# Patient Record
Sex: Male | Born: 1985 | Race: White | Hispanic: No | Marital: Single | Smoking: Never smoker
Health system: Southern US, Community
[De-identification: ages and names within clinical notes are randomized; demographics above are authoritative.]

## PROBLEM LIST (undated history)

## (undated) DIAGNOSIS — E119 Type 2 diabetes mellitus without complications: Secondary | ICD-10-CM

---

## 2016-05-06 ENCOUNTER — Emergency Department (HOSPITAL_COMMUNITY)
Admission: EM | Admit: 2016-05-06 | Discharge: 2016-05-07 | Disposition: A | Discharge status: Home / Self Care | Payer: BLUE CROSS/BLUE SHIELD | Attending: Emergency Medicine | Admitting: Emergency Medicine

## 2016-05-06 ENCOUNTER — Encounter (HOSPITAL_COMMUNITY): Payer: Self-pay | Admitting: *Deleted

## 2016-05-06 ENCOUNTER — Emergency Department (HOSPITAL_COMMUNITY): Payer: BLUE CROSS/BLUE SHIELD

## 2016-05-06 DIAGNOSIS — E119 Type 2 diabetes mellitus without complications: Secondary | ICD-10-CM

## 2016-05-06 DIAGNOSIS — I10 Essential (primary) hypertension: Secondary | ICD-10-CM | POA: Diagnosis not present

## 2016-05-06 DIAGNOSIS — R509 Fever, unspecified: Secondary | ICD-10-CM | POA: Diagnosis present

## 2016-05-06 MED ORDER — SODIUM CHLORIDE 0.9 % IV BOLUS (SEPSIS)
30.0000 mL/kg | Freq: Once | INTRAVENOUS | Status: AC
  Administered 2016-05-07: 2517 mL via INTRAVENOUS

## 2016-05-06 MED ORDER — IBUPROFEN 800 MG PO TABS
800.0000 mg | ORAL_TABLET | Freq: Once | ORAL | Status: AC
  Administered 2016-05-07: 800 mg via ORAL
  Filled 2016-05-06: qty 1

## 2016-05-06 NOTE — ED Notes (Signed)
Bed: [id number] Expected date:  Expected time:  Means of arrival:  Comments: Triage 6

## 2016-05-06 NOTE — ED Notes (Addendum)
C/o fever on and off for about 3 weeks, has completed z pack. Has also been taking day quil and nyquil without relief. Has also had shortness of breath tonight, a "a little bit of coughing". Last tylenol around 2200 (1 gram).

## 2016-05-07 ENCOUNTER — Encounter (HOSPITAL_COMMUNITY): Payer: Self-pay | Admitting: Emergency Medicine

## 2016-05-07 LAB — COMPREHENSIVE METABOLIC PANEL
ALBUMIN: 4.3 g/dL (ref 3.5–5.0)
ALT: 24 U/L (ref 17–63)
ANION GAP: 8 (ref 5–15)
AST: 21 U/L (ref 15–41)
Alkaline Phosphatase: 47 U/L (ref 38–126)
BILIRUBIN TOTAL: 1.5 mg/dL — AB (ref 0.3–1.2)
BUN: 9 mg/dL (ref 6–20)
CHLORIDE: 100 mmol/L — AB (ref 101–111)
CO2: 23 mmol/L (ref 22–32)
Calcium: 8.6 mg/dL — ABNORMAL LOW (ref 8.9–10.3)
Creatinine, Ser: 0.81 mg/dL (ref 0.61–1.24)
GFR calc Af Amer: >60 mL/min (ref >60)
GFR calc non Af Amer: >60 mL/min (ref >60)
GLUCOSE: 311 mg/dL — AB (ref 65–99)
POTASSIUM: 3.7 mmol/L (ref 3.5–5.1)
SODIUM: 131 mmol/L — AB (ref 135–145)
TOTAL PROTEIN: 7.7 g/dL (ref 6.5–8.1)

## 2016-05-07 LAB — CBC WITH DIFFERENTIAL/PLATELET
BASOS PCT: 0 %
Basophils Absolute: 0 10*3/uL (ref 0.0–0.1)
Eosinophils Absolute: 0 10*3/uL (ref 0.0–0.7)
Eosinophils Relative: 0 %
HEMATOCRIT: 35.2 % — AB (ref 39.0–52.0)
HEMOGLOBIN: 12.6 g/dL — AB (ref 13.0–17.0)
LYMPHS ABS: 0.5 10*3/uL — AB (ref 0.7–4.0)
LYMPHS PCT: 8 %
MCH: 29.2 pg (ref 26.0–34.0)
MCHC: 35.8 g/dL (ref 30.0–36.0)
MCV: 81.5 fL (ref 78.0–100.0)
MONO ABS: 0.5 10*3/uL (ref 0.1–1.0)
MONOS PCT: 8 %
NEUTROS ABS: 5.3 10*3/uL (ref 1.7–7.7)
NEUTROS PCT: 84 %
Platelets: 197 10*3/uL (ref 150–400)
RBC: 4.32 MIL/uL (ref 4.22–5.81)
RDW: 12.6 % (ref 11.5–15.5)
WBC: 6.4 10*3/uL (ref 4.0–10.5)

## 2016-05-07 LAB — URINALYSIS, ROUTINE W REFLEX MICROSCOPIC
BILIRUBIN URINE: NEGATIVE
KETONES UR: NEGATIVE mg/dL
Leukocytes, UA: NEGATIVE
NITRITE: NEGATIVE
PH: 6 (ref 5.0–8.0)
Protein, ur: 30 mg/dL — AB
SPECIFIC GRAVITY, URINE: 1.016 (ref 1.005–1.030)

## 2016-05-07 LAB — URINE MICROSCOPIC-ADD ON
Bacteria, UA: NONE SEEN
Squamous Epithelial / LPF: NONE SEEN
WBC, UA: NONE SEEN WBC/hpf (ref 0–5)

## 2016-05-07 LAB — CBG MONITORING, ED: GLUCOSE-CAPILLARY: 272 mg/dL — AB (ref 65–99)

## 2016-05-07 LAB — I-STAT CG4 LACTIC ACID, ED: Lactic Acid, Venous: 1.62 mmol/L (ref 0.5–2.0)

## 2016-05-07 MED ORDER — GI COCKTAIL ~~LOC~~
30.0000 mL | Freq: Once | ORAL | Status: AC
  Administered 2016-05-07: 30 mL via ORAL
  Filled 2016-05-07: qty 30

## 2016-05-07 MED ORDER — METFORMIN HCL 500 MG PO TABS: 500.0000 mg | ORAL_TABLET | Freq: Two times a day (BID) | ORAL | Status: AC

## 2016-05-07 MED ORDER — METFORMIN HCL 500 MG PO TABS
500.0000 mg | ORAL_TABLET | Freq: Once | ORAL | Status: AC
  Administered 2016-05-07: 500 mg via ORAL
  Filled 2016-05-07: qty 1

## 2016-05-07 MED ORDER — SODIUM CHLORIDE 0.9 % IV BOLUS (SEPSIS): 500.0000 mL | Freq: Once | INTRAVENOUS | Status: DC

## 2016-05-07 NOTE — ED Notes (Signed)
Pt has a urinal at bedside and aware that a urine sample is needed. 

## 2016-05-07 NOTE — ED Provider Notes (Signed)
CSN: [id number]     Arrival date & time 05/06/16  2315 History  By signing my name below, I, Jasmyn B. Alexander, attest that this documentation has been prepared under the direction and in the presence of Sophiamarie Nease, MD.  Electronically Signed: Gillis Ends. Lyn Hollingshead, ED Scribe. 05/07/2016. 1:22 AM.      Chief Complaint  Patient presents with  . Fever   Patient is a 30 y.o. male presenting with fever. The history is provided by the patient. No language interpreter was used.  Fever Max temp prior to arrival:  103.6 Temp source:  Oral (subjective until today) Severity:  Moderate Onset quality:  Gradual Duration:  6 hours Timing:  Intermittent Progression:  Unchanged Chronicity:  Recurrent Relieved by:  Nothing Worsened by:  Nothing tried Ineffective treatments:  Acetaminophen Associated symptoms: cough   Associated symptoms: no chest pain, no confusion, no congestion, no diarrhea, no ear pain, no nausea, no rash, no somnolence, no sore throat and no vomiting   Risk factors: no hx of cancer     HPI Comments: Mike Moore is a 30 y.o. male who presents to the Emergency Department complaining of intermittent fever (TMAX 103.6) x 3 weeks. Pt reports he went to Urgent Care on 05/01/16 when he received Z pack prescription due to unknown infection, which he believes was bronchitis. Pt has completed 5 day Z pack with moderate relief. Prior to Z pack prescription, pt was taking Dayquil and Nyquil with no relief. However, he reports after consuming lunch on 05/06/16, he felts symptoms return with associated cough, shortness of breath, and itchy eyes. Symptoms have resolved at this time. Denies ear pain, throat pain, rhinorrhea,and runny eyes.  Past Medical History  Diagnosis Date  . Hypertension    No past surgical history on file. No family history on file. Social History  Substance Use Topics  . Smoking status: Never Smoker   . Smokeless tobacco: None  . Alcohol Use: No    Review of  Systems  Constitutional: Positive for fever. Negative for unexpected weight change.  HENT: Negative for congestion, ear pain, sore throat, trouble swallowing and voice change.   Eyes: Positive for itching. Negative for discharge.  Respiratory: Positive for cough. Negative for wheezing.   Cardiovascular: Negative for chest pain, palpitations and leg swelling.  Gastrointestinal: Negative for nausea, vomiting, abdominal pain and diarrhea.  Musculoskeletal: Negative for neck pain and neck stiffness.  Skin: Negative for rash.  Psychiatric/Behavioral: Negative for confusion.  All other systems reviewed and are negative.   Allergies  Review of patient's allergies indicates no known allergies.  Home Medications   Prior to Admission medications   Not on File   BP 133/92 mmHg  Pulse 101  Temp(Src) 98.9 F (37.2 C) (Oral)  Resp 16  Ht 5\' 8"  (1.727 m)  Wt 185 lb (83.915 kg)  BMI 28.14 kg/m2  SpO2 98% Physical Exam  Constitutional: He is oriented to person, place, and time. He appears well-developed and well-nourished. No distress.  HENT:  Head: Normocephalic and atraumatic.  Mouth/Throat: Oropharynx is clear and moist. No oropharyngeal exudate.  Moist mucous membranes   Eyes: Conjunctivae are normal. Pupils are equal, round, and reactive to light.  Neck: Normal range of motion. Neck supple. No JVD present.  Trachea midline No bruit  Cardiovascular: Normal rate, regular rhythm and normal heart sounds.   Pulmonary/Chest: Effort normal and breath sounds normal. No stridor. No respiratory distress. He has no wheezes. He has no rales.  Abdominal: Soft.  Bowel sounds are normal. He exhibits no distension and no mass. There is no tenderness. There is no rebound and no guarding.  Gassy  Musculoskeletal: Normal range of motion. He exhibits no edema or tenderness.  Lymphadenopathy:    He has no cervical adenopathy.  Neurological: He is alert and oriented to person, place, and time. He has  normal reflexes.  Skin: Skin is warm and dry.  Psychiatric: He has a normal mood and affect. His behavior is normal.  Nursing note and vitals reviewed.  ED Course  Procedures (including critical care time) DIAGNOSTIC STUDIES: Oxygen Saturation is 99% on RA, normal by my interpretation.    COORDINATION OF CARE: 1:13 AM-Discussed treatment plan which includes CXR, CBC panel, CMP, and UA with pt at bedside and pt agreed to plan.   Labs Review Labs Reviewed  COMPREHENSIVE METABOLIC PANEL - Abnormal; Notable for the following:    Sodium 131 (*)    Chloride 100 (*)    Glucose, Bld 311 (*)    Calcium 8.6 (*)    Total Bilirubin 1.5 (*)    All other components within normal limits  CBC WITH DIFFERENTIAL/PLATELET - Abnormal; Notable for the following:    Hemoglobin 12.6 (*)    HCT 35.2 (*)    Lymphs Abs 0.5 (*)    All other components within normal limits  URINALYSIS, ROUTINE W REFLEX MICROSCOPIC (NOT AT [facility]) - Abnormal; Notable for the following:    Glucose, UA >1000 (*)    Hgb urine dipstick MODERATE (*)    Protein, ur 30 (*)    All other components within normal limits  CULTURE, BLOOD (ROUTINE X 2)  CULTURE, BLOOD (ROUTINE X 2)  URINE CULTURE  URINE MICROSCOPIC-ADD ON  I-STAT CG4 LACTIC ACID, ED    Imaging Review Dg Chest 2 View  05/07/2016  CLINICAL DATA:  Pt c/o sob, cough, congestion, weakness, and intermittent fever x 2 weeks, pt hearing impaired EXAM: CHEST  2 VIEW COMPARISON:  None. FINDINGS: Shallow inspiration with atelectasis in the lung bases. Normal heart size and pulmonary vascularity. No focal airspace disease or consolidation in the lungs. No blunting of costophrenic angles. No pneumothorax. Mediastinal contours appear intact. Degenerative changes in the spine. IMPRESSION: Shallow inspiration with atelectasis in the lung bases. Electronically Signed   By: Burman Nieves M.D.   On: 05/07/2016 00:53   I have personally reviewed and evaluated these images and lab  results as part of my medical decision-making.  MDM   Final diagnoses:  None    Filed Vitals:   05/07/16 0008 05/07/16 0103  BP: 130/87 133/92  Pulse: 105 101  Temp: 100.6 F (38.1 C) 98.9 F (37.2 C)  Resp: 18 16    Results for orders placed or performed during the hospital encounter of 05/06/16  Comprehensive metabolic panel  Result Value Ref Range   Sodium 131 (L) 135 - 145 mmol/L   Potassium 3.7 3.5 - 5.1 mmol/L   Chloride 100 (L) 101 - 111 mmol/L   CO2 23 22 - 32 mmol/L   Glucose, Bld 311 (H) 65 - 99 mg/dL   BUN 9 6 - 20 mg/dL   Creatinine, Ser 9.60 0.61 - 1.24 mg/dL   Calcium 8.6 (L) 8.9 - 10.3 mg/dL   Total Protein 7.7 6.5 - 8.1 g/dL   Albumin 4.3 3.5 - 5.0 g/dL   AST 21 15 - 41 U/L   ALT 24 17 - 63 U/L   Alkaline Phosphatase 47 38 - 126  U/L   Total Bilirubin 1.5 (H) 0.3 - 1.2 mg/dL   GFR calc non Af Amer >60 >60 mL/min   GFR calc Af Amer >60 >60 mL/min   Anion gap 8 5 - 15  CBC with Differential  Result Value Ref Range   WBC 6.4 4.0 - 10.5 K/uL   RBC 4.32 4.22 - 5.81 MIL/uL   Hemoglobin 12.6 (L) 13.0 - 17.0 g/dL   HCT 40.9 (L) 81.1 - 91.4 %   MCV 81.5 78.0 - 100.0 fL   MCH 29.2 26.0 - 34.0 pg   MCHC 35.8 30.0 - 36.0 g/dL   RDW 78.2 95.6 - 21.3 %   Platelets 197 150 - 400 K/uL   Neutrophils Relative % 84 %   Neutro Abs 5.3 1.7 - 7.7 K/uL   Lymphocytes Relative 8 %   Lymphs Abs 0.5 (L) 0.7 - 4.0 K/uL   Monocytes Relative 8 %   Monocytes Absolute 0.5 0.1 - 1.0 K/uL   Eosinophils Relative 0 %   Eosinophils Absolute 0.0 0.0 - 0.7 K/uL   Basophils Relative 0 %   Basophils Absolute 0.0 0.0 - 0.1 K/uL  Urinalysis, Routine w reflex microscopic  Result Value Ref Range   Color, Urine YELLOW YELLOW   APPearance CLEAR CLEAR   Specific Gravity, Urine 1.016 1.005 - 1.030   pH 6.0 5.0 - 8.0   Glucose, UA >1000 (A) NEGATIVE mg/dL   Hgb urine dipstick MODERATE (A) NEGATIVE   Bilirubin Urine NEGATIVE NEGATIVE   Ketones, ur NEGATIVE NEGATIVE mg/dL   Protein,  ur 30 (A) NEGATIVE mg/dL   Nitrite NEGATIVE NEGATIVE   Leukocytes, UA NEGATIVE NEGATIVE  Urine microscopic-add on  Result Value Ref Range   Squamous Epithelial / LPF NONE SEEN NONE SEEN   WBC, UA NONE SEEN 0 - 5 WBC/hpf   RBC / HPF 0-5 0 - 5 RBC/hpf   Bacteria, UA NONE SEEN NONE SEEN  I-Stat CG4 Lactic Acid, ED  Result Value Ref Range   Lactic Acid, Venous 1.62 0.5 - 2.0 mmol/L  POC CBG, ED  Result Value Ref Range   Glucose-Capillary 272 (H) 65 - 99 mg/dL   Dg Chest 2 View  0/86/5784  CLINICAL DATA:  Pt c/o sob, cough, congestion, weakness, and intermittent fever x 2 weeks, pt hearing impaired EXAM: CHEST  2 VIEW COMPARISON:  None. FINDINGS: Shallow inspiration with atelectasis in the lung bases. Normal heart size and pulmonary vascularity. No focal airspace disease or consolidation in the lungs. No blunting of costophrenic angles. No pneumothorax. Mediastinal contours appear intact. Degenerative changes in the spine. IMPRESSION: Shallow inspiration with atelectasis in the lung bases. Electronically Signed   By: Burman Nieves M.D.   On: 05/07/2016 00:53    Medications  sodium chloride 0.9 % bolus 500 mL (not administered)  sodium chloride 0.9 % bolus 2,517 mL (0 mL/kg  83.9 kg Intravenous Stopped 05/07/16 0256)  ibuprofen (ADVIL,MOTRIN) tablet 800 mg (800 mg Oral Given 05/07/16 0015)  metFORMIN (GLUCOPHAGE) tablet 500 mg (500 mg Oral Given 05/07/16 0210)  gi cocktail (Maalox,Lidocaine,Donnatal) (30 mLs Oral Given 05/07/16 0211)    laughing in the room smiling well appearing. Exam is benign and reassuring.  New onset DM in patient.  Will start metformin follow up with your PMD in 2 days.  Carb modified diet.  Symptoms consistent with viral illness.  Zpak is still active in patients blood stream.  Strict return precautions given  I personally performed the services described in this  documentation, which was scribed in my presence. The recorded information has been reviewed and is  accurate.      Cy BlamerApril Brodrick Curran, MD 05/07/16 [id number]

## 2016-05-07 NOTE — Discharge Instructions (Signed)
Diabetes and Exercise Exercising regularly is important. It is not just about losing weight. It has many health benefits, such as:  Improving your overall fitness, flexibility, and endurance.  Increasing your bone density.  Helping with weight control.  Decreasing your body fat.  Increasing your muscle strength.  Reducing stress and tension.  Improving your overall health. People with diabetes who exercise gain additional benefits because exercise:  Reduces appetite.  Improves the body's use of blood sugar (glucose).  Helps lower or control blood glucose.  Decreases blood pressure.  Helps control blood lipids (such as cholesterol and triglycerides).  Improves the body's use of the hormone insulin by:  Increasing the body's insulin sensitivity.  Reducing the body's insulin needs.  Decreases the risk for heart disease because exercising:  Lowers cholesterol and triglycerides levels.  Increases the levels of good cholesterol (such as high-density lipoproteins [HDL]) in the body.  Lowers blood glucose levels. YOUR ACTIVITY PLAN  Choose an activity that you enjoy, and set realistic goals. To exercise safely, you should begin practicing any new physical activity slowly, and gradually increase the intensity of the exercise over time. Your health care provider or diabetes educator can help create an activity plan that works for you. General recommendations include:  Encouraging children to engage in at least 60 minutes of physical activity each day.  Stretching and performing strength training exercises, such as yoga or weight lifting, at least 2 times per week.  Performing a total of at least 150 minutes of moderate-intensity exercise each week, such as brisk walking or water aerobics.  Exercising at least 3 days per week, making sure you allow no more than 2 consecutive days to pass without exercising.  Avoiding long periods of inactivity (90 minutes or more). When you  have to spend an extended period of time sitting down, take frequent breaks to walk or stretch. RECOMMENDATIONS FOR EXERCISING WITH TYPE 1 OR TYPE 2 DIABETES   Check your blood glucose before exercising. If blood glucose levels are greater than 240 mg/dL, check for urine ketones. Do not exercise if ketones are present.  Avoid injecting insulin into areas of the body that are going to be exercised. For example, avoid injecting insulin into:  The arms when playing tennis.  The legs when jogging.  Keep a record of:  Food intake before and after you exercise.  Expected peak times of insulin action.  Blood glucose levels before and after you exercise.  The type and amount of exercise you have done.  Review your records with your health care provider. Your health care provider will help you to develop guidelines for adjusting food intake and insulin amounts before and after exercising.  If you take insulin or oral hypoglycemic agents, watch for signs and symptoms of hypoglycemia. They include:  Dizziness.  Shaking.  Sweating.  Chills.  Confusion.  Drink plenty of water while you exercise to prevent dehydration or heat stroke. Body water is lost during exercise and must be replaced.  Talk to your health care provider before starting an exercise program to make sure it is safe for you. Remember, almost any type of activity is better than none.   This information is not intended to replace advice given to you by your health care provider. Make sure you discuss any questions you have with your health care provider.   Document Released: 02/01/2004 Document Revised: 03/28/2015 Document Reviewed: 04/20/2013 Elsevier Interactive Patient Education 2016 [employer]  

## 2016-05-08 LAB — URINE CULTURE: Culture: <10000 — AB

## 2016-05-12 LAB — CULTURE, BLOOD (ROUTINE X 2)
CULTURE: NO GROWTH
Culture: NO GROWTH

## 2016-05-23 ENCOUNTER — Encounter (HOSPITAL_COMMUNITY): Payer: Self-pay | Admitting: Emergency Medicine

## 2016-05-23 ENCOUNTER — Inpatient Hospital Stay (HOSPITAL_COMMUNITY)
Admission: EM | Admit: 2016-05-23 | Discharge: 2016-05-25 | DRG: 690 | Disposition: A | Discharge status: Home / Self Care | Payer: BLUE CROSS/BLUE SHIELD | Attending: Internal Medicine | Admitting: Internal Medicine

## 2016-05-23 ENCOUNTER — Other Ambulatory Visit: Payer: Self-pay

## 2016-05-23 DIAGNOSIS — N151 Renal and perinephric abscess: Secondary | ICD-10-CM | POA: Diagnosis not present

## 2016-05-23 DIAGNOSIS — D649 Anemia, unspecified: Secondary | ICD-10-CM | POA: Diagnosis present

## 2016-05-23 DIAGNOSIS — E118 Type 2 diabetes mellitus with unspecified complications: Secondary | ICD-10-CM

## 2016-05-23 DIAGNOSIS — R161 Splenomegaly, not elsewhere classified: Secondary | ICD-10-CM | POA: Diagnosis present

## 2016-05-23 DIAGNOSIS — R1031 Right lower quadrant pain: Secondary | ICD-10-CM

## 2016-05-23 DIAGNOSIS — Z7984 Long term (current) use of oral hypoglycemic drugs: Secondary | ICD-10-CM | POA: Diagnosis not present

## 2016-05-23 DIAGNOSIS — Z79899 Other long term (current) drug therapy: Secondary | ICD-10-CM | POA: Diagnosis not present

## 2016-05-23 DIAGNOSIS — K8020 Calculus of gallbladder without cholecystitis without obstruction: Secondary | ICD-10-CM

## 2016-05-23 DIAGNOSIS — N12 Tubulo-interstitial nephritis, not specified as acute or chronic: Secondary | ICD-10-CM | POA: Diagnosis present

## 2016-05-23 DIAGNOSIS — R319 Hematuria, unspecified: Secondary | ICD-10-CM

## 2016-05-23 DIAGNOSIS — R52 Pain, unspecified: Secondary | ICD-10-CM

## 2016-05-23 DIAGNOSIS — E1165 Type 2 diabetes mellitus with hyperglycemia: Secondary | ICD-10-CM | POA: Diagnosis present

## 2016-05-23 HISTORY — DX: Type 2 diabetes mellitus without complications: E11.9

## 2016-05-23 LAB — URINALYSIS, ROUTINE W REFLEX MICROSCOPIC
BILIRUBIN URINE: NEGATIVE
Glucose, UA: NEGATIVE mg/dL
KETONES UR: NEGATIVE mg/dL
Leukocytes, UA: NEGATIVE
Nitrite: NEGATIVE
PH: 6 (ref 5.0–8.0)
Protein, ur: NEGATIVE mg/dL

## 2016-05-23 LAB — COMPREHENSIVE METABOLIC PANEL
ALBUMIN: 3.8 g/dL (ref 3.5–5.0)
ALK PHOS: 51 U/L (ref 38–126)
ALT: 16 U/L — ABNORMAL LOW (ref 17–63)
ANION GAP: 11 (ref 5–15)
AST: 16 U/L (ref 15–41)
BILIRUBIN TOTAL: 0.8 mg/dL (ref 0.3–1.2)
BUN: 9 mg/dL (ref 6–20)
CALCIUM: 8.3 mg/dL — AB (ref 8.9–10.3)
CO2: 23 mmol/L (ref 22–32)
Chloride: 99 mmol/L — ABNORMAL LOW (ref 101–111)
Creatinine, Ser: 0.76 mg/dL (ref 0.61–1.24)
GFR calc Af Amer: >60 mL/min (ref >60)
GFR calc non Af Amer: >60 mL/min (ref >60)
GLUCOSE: 187 mg/dL — AB (ref 65–99)
Potassium: 3.8 mmol/L (ref 3.5–5.1)
Sodium: 133 mmol/L — ABNORMAL LOW (ref 135–145)
TOTAL PROTEIN: 7.9 g/dL (ref 6.5–8.1)

## 2016-05-23 LAB — DIFFERENTIAL
BASOS ABS: 0 10*3/uL (ref 0.0–0.1)
BASOS PCT: 1 %
EOS ABS: 0.1 10*3/uL (ref 0.0–0.7)
Eosinophils Relative: 2 %
LYMPHS ABS: 0.9 10*3/uL (ref 0.7–4.0)
Lymphocytes Relative: 21 %
Monocytes Absolute: 0.3 10*3/uL (ref 0.1–1.0)
Monocytes Relative: 8 %
NEUTROS ABS: 2.9 10*3/uL (ref 1.7–7.7)
NEUTROS PCT: 68 %

## 2016-05-23 LAB — URINE MICROSCOPIC-ADD ON
Bacteria, UA: NONE SEEN
Squamous Epithelial / LPF: NONE SEEN

## 2016-05-23 LAB — CBC
HCT: 29.1 % — ABNORMAL LOW (ref 39.0–52.0)
HEMOGLOBIN: 10.1 g/dL — AB (ref 13.0–17.0)
MCH: 27.4 pg (ref 26.0–34.0)
MCHC: 34.7 g/dL (ref 30.0–36.0)
MCV: 79.1 fL (ref 78.0–100.0)
Platelets: 314 10*3/uL (ref 150–400)
RBC: 3.68 MIL/uL — ABNORMAL LOW (ref 4.22–5.81)
RDW: 12.5 % (ref 11.5–15.5)
WBC: 4.1 10*3/uL (ref 4.0–10.5)

## 2016-05-23 LAB — MONONUCLEOSIS SCREEN: Mono Screen: NEGATIVE

## 2016-05-23 LAB — LIPASE, BLOOD: Lipase: 37 U/L (ref 11–51)

## 2016-05-23 LAB — GLUCOSE, CAPILLARY: GLUCOSE-CAPILLARY: 122 mg/dL — AB (ref 65–99)

## 2016-05-23 MED ORDER — INSULIN ASPART 100 UNIT/ML ~~LOC~~ SOLN: 0.0000 [IU] | Freq: Every day | SUBCUTANEOUS | Status: DC

## 2016-05-23 MED ORDER — PNEUMOCOCCAL VAC POLYVALENT 25 MCG/0.5ML IJ INJ
0.5000 mL | INJECTION | INTRAMUSCULAR | Status: AC
Start: 2016-05-24 — End: 2016-05-24
  Administered 2016-05-24: 0.5 mL via INTRAMUSCULAR
  Filled 2016-05-23 (×2): qty 0.5

## 2016-05-23 MED ORDER — ONDANSETRON HCL 4 MG PO TABS
4.0000 mg | ORAL_TABLET | Freq: Four times a day (QID) | ORAL | Status: DC | PRN
Start: 2016-05-23 — End: 2016-05-25

## 2016-05-23 MED ORDER — SODIUM CHLORIDE 0.9 % IV BOLUS (SEPSIS)
1000.0000 mL | Freq: Once | INTRAVENOUS | Status: AC
  Administered 2016-05-23: 1000 mL via INTRAVENOUS

## 2016-05-23 MED ORDER — ACETAMINOPHEN 325 MG PO TABS: 650.0000 mg | ORAL_TABLET | Freq: Four times a day (QID) | ORAL | Status: DC | PRN

## 2016-05-23 MED ORDER — SODIUM CHLORIDE 0.9 % IV SOLN
INTRAVENOUS | Status: AC
  Administered 2016-05-23: 23:00:00 via INTRAVENOUS

## 2016-05-23 MED ORDER — ACETAMINOPHEN 650 MG RE SUPP: 650.0000 mg | Freq: Four times a day (QID) | RECTAL | Status: DC | PRN

## 2016-05-23 MED ORDER — INSULIN ASPART 100 UNIT/ML ~~LOC~~ SOLN
0.0000 [IU] | Freq: Three times a day (TID) | SUBCUTANEOUS | Status: DC
  Administered 2016-05-24 (×2): 3 [IU] via SUBCUTANEOUS
  Administered 2016-05-25: 2 [IU] via SUBCUTANEOUS
  Administered 2016-05-25: 3 [IU] via SUBCUTANEOUS
  Administered 2016-05-25: 2 [IU] via SUBCUTANEOUS

## 2016-05-23 MED ORDER — ONDANSETRON HCL 4 MG/2ML IJ SOLN: 4.0000 mg | Freq: Four times a day (QID) | INTRAMUSCULAR | Status: DC | PRN

## 2016-05-23 MED ORDER — DEXTROSE 5 % IV SOLN
1.0000 g | Freq: Every day | INTRAVENOUS | Status: DC
  Administered 2016-05-23 – 2016-05-24 (×2): 1 g via INTRAVENOUS
  Filled 2016-05-23 (×4): qty 10

## 2016-05-23 MED ORDER — HYDROCODONE-ACETAMINOPHEN 5-325 MG PO TABS: 1.0000 | ORAL_TABLET | ORAL | Status: DC | PRN

## 2016-05-23 MED ORDER — CEFTRIAXONE SODIUM 1 G IJ SOLR: 1.0000 g | Freq: Once | INTRAMUSCULAR | Status: DC

## 2016-05-23 MED ORDER — ENOXAPARIN SODIUM 40 MG/0.4ML ~~LOC~~ SOLN
40.0000 mg | Freq: Every day | SUBCUTANEOUS | Status: DC
  Administered 2016-05-24: 40 mg via SUBCUTANEOUS
  Filled 2016-05-23: qty 0.4

## 2016-05-23 NOTE — ED Notes (Signed)
Per PCP-patient with right lower abdominal pain, fevers-states CT positive for pyelo, enlarged spleen, HgB 10-to ED for admission, IV antibiotics

## 2016-05-23 NOTE — ED Notes (Signed)
Pt sent by PCP for abnormal CT results of pyelonephritis and splenomegaly. Has had dysuria for the past week. Has not been on abx yet.

## 2016-05-23 NOTE — H&P (Addendum)
Mike BeathRobert Moore [id number] DOB: July 16, 1986 DOA: 05/23/2016     PCP: No PCP Per Patient   Outpatient Specialists: none Patient coming from:    home Lives alone,        Chief Complaint: Intermittent fevers  HPI: Mike Moore is a 30 y.o. male with medical history significant of recently diagnosed diabetes     Presented with about 1 month of intermittent fevers on and off he was treated for presumed bronchitis with a Z-Pak his fever would go away but then would return. He was seen in the emergency department and diagnosed with viral illness on 13 of June at that time his hemoglobin was 12.6 His urine and blood cultures at that time did not grow anything.  About 2 weeks ago he developed right flank Lower quadrant pain, also he continued to have recurrent fevers and presented to [facility]. Given right lower quadrant pain they will concern for appendicitis and  obtained CT of the abdomen and pelvis which was read today showing perinephric stranding with microabscess and splenomegaly he was sent  to emergency department. His fevers is currently resolved he have not had been having nausea vomiting or diarrhea no abdominal discomfort or flank pain. He had had persistent blood in urine Patient was recently diagnosed with diabetes and started on metformin Labs were obtained at his PCP showed WBC 6.1 1.5 absolute lymphocytic count in 4.5 absolute neutrophil count hemoglobin 10.7 which is new platelets 359  Hemoglobin A1c is 9.1 LFTs are normal   IN ER: Afebrile heart rate 85 respirations 15 blood pressure 142/90 CBC 4.1 hemoglobin 10.1 sodium 133 calcium 8.3 glucose 187 Outside CT scan was read by radiologist confirms the findings    Hospitalist was called for admission for perinephric abscess and pyelonephritis with splenomegaly  Review of Systems:    Pertinent positives include: Fevers, chills, fatigue, right flank pain right abdominal pain  Constitutional:  No weight loss, night  sweats,  weight loss  HEENT:  No headaches, Difficulty swallowing,Tooth/dental problems,Sore throat,  No sneezing, itching, ear ache, nasal congestion, post nasal drip,  Cardio-vascular:  No chest pain, Orthopnea, PND, anasarca, dizziness, palpitations.no Bilateral lower extremity swelling  GI:  No heartburn, indigestion,   nausea, vomiting, diarrhea, change in bowel habits, loss of appetite, melena, blood in stool, hematemesis Resp:  no shortness of breath at rest. No dyspnea on exertion, No excess mucus, no productive cough, No non-productive cough, No coughing up of blood.No change in color of mucus.No wheezing. Skin:  no rash or lesions. No jaundice GU:  no dysuria, change in color of urine, no urgency or frequency. No straining to urinate.  No flank pain.  Musculoskeletal:  No joint pain or no joint swelling. No decreased range of motion. No back pain.  Psych:  No change in mood or affect. No depression or anxiety. No memory loss.  Neuro: no localizing neurological complaints, no tingling, no weakness, no double vision, no gait abnormality, no slurred speech, no confusion  As per HPI otherwise 10 point review of systems negative.   Past Medical History: Past Medical History  Diagnosis Date  . Diabetes mellitus without complication (HCC)    History reviewed. No pertinent past surgical history.   Social History:  Ambulatory   Independently    reports that he has never smoked. He does not have any smokeless tobacco history on file. He reports that he does not drink alcohol or use illicit drugs.  Allergies:  No Known Allergies  Family History:  Family History  Problem Relation Age of Onset  . Breast cancer Mother   . Liver disease Father   . Alcohol abuse Father   . Diabetes Other   . Lung cancer Other   . Liver cancer Other   . Kidney disease Neg Hx     Medications: Prior to Admission medications   Medication Sig Start Date End Date Taking? Authorizing  Provider  ibuprofen (ADVIL,MOTRIN) 200 MG tablet Take 400 mg by mouth every 6 (six) hours as needed for fever.    Yes Historical Provider, MD  metFORMIN (GLUCOPHAGE) 500 MG tablet Take 1 tablet (500 mg total) by mouth 2 (two) times daily with a meal. Patient taking differently: Take 500 mg by mouth 2 (two) times daily with a meal. Takes with lunch and dinner 05/07/16  Yes April Palumbo, MD    Physical Exam: Patient Vitals for the past 24 hrs:  BP Temp Temp src Pulse Resp SpO2 Height Weight  05/23/16 1901 - - - - - - 5\' 8"  (1.727 m) 81.647 kg (180 lb)  05/23/16 1848 142/90 mmHg 97.9 F (36.6 C) Oral 85 15 100 % - -  05/23/16 1509 115/80 mmHg 98.3 F (36.8 C) Oral 92 16 100 % - -    1. General:  in No Acute distress, hearing impaired 2. Psychological: Alert and   Oriented 3. Head/ENT:   Moist  Mucous Membranes                          Head Non traumatic, neck supple                          Normal  Dentition 4. SKIN:  decreased Skin turgor,  Skin clean Dry and intact no rash 5. Heart: Regular rate and rhythm no Murmur, Rub or gallop 6. Lungs:  Clear to auscultation bilaterally, no wheezes or crackles   7. Abdomen: Soft, non-tender, Non distended 8. Lower extremities: no clubbing, cyanosis, or edema 9. Neurologically Grossly intact, moving all 4 extremities equally 10. MSK: Normal range of motion   body mass index is 27.38 kg/(m^2).  Labs on Admission:   Labs on Admission: I have personally reviewed following labs and imaging studies  CBC:  Recent Labs Lab 05/23/16 1526  WBC 4.1  HGB 10.1*  HCT 29.1*  MCV 79.1  PLT 314   Basic Metabolic Panel:  Recent Labs Lab 05/23/16 1526  NA 133*  K 3.8  CL 99*  CO2 23  GLUCOSE 187*  BUN 9  CREATININE 0.76  CALCIUM 8.3*   GFR: Estimated Creatinine Clearance: 129.4 mL/min (by C-G formula based on Cr of 0.76). Liver Function Tests:  Recent Labs Lab 05/23/16 1526  AST 16  ALT 16*  ALKPHOS 51  BILITOT 0.8  PROT 7.9    ALBUMIN 3.8    Recent Labs Lab 05/23/16 1526  LIPASE 37   No results for input(s): AMMONIA in the last 168 hours. Coagulation Profile: No results for input(s): INR, PROTIME in the last 168 hours. Cardiac Enzymes: No results for input(s): CKTOTAL, CKMB, CKMBINDEX, TROPONINI in the last 168 hours. BNP (last 3 results) No results for input(s): PROBNP in the last 8760 hours. HbA1C: No results for input(s): HGBA1C in the last 72 hours. CBG: No results for input(s): GLUCAP in the last 168 hours. Lipid Profile: No results for input(s): CHOL, HDL, LDLCALC, TRIG, CHOLHDL, LDLDIRECT in the last  72 hours. Thyroid Function Tests: No results for input(s): TSH, T4TOTAL, FREET4, T3FREE, THYROIDAB in the last 72 hours. Anemia Panel: No results for input(s): VITAMINB12, FOLATE, FERRITIN, TIBC, IRON, RETICCTPCT in the last 72 hours. Urine analysis:    Component Value Date/Time   COLORURINE YELLOW 05/23/2016 1836   APPEARANCEUR CLEAR 05/23/2016 1836   LABSPEC >1.046* 05/23/2016 1836   PHURINE 6.0 05/23/2016 1836   GLUCOSEU NEGATIVE 05/23/2016 1836   HGBUR MODERATE* 05/23/2016 1836   BILIRUBINUR NEGATIVE 05/23/2016 1836   KETONESUR NEGATIVE 05/23/2016 1836   PROTEINUR NEGATIVE 05/23/2016 1836   NITRITE NEGATIVE 05/23/2016 1836   LEUKOCYTESUR NEGATIVE 05/23/2016 1836   Sepsis Labs: (procalcitonin:4,lacticidven:4) )No results found for this or any previous visit (from the past 240 hour(s)).     UA   no evidence of UTI But hematuria  No results found for: HGBA1C  Estimated Creatinine Clearance: 129.4 mL/min (by C-G formula based on Cr of 0.76).  BNP (last 3 results) No results for input(s): PROBNP in the last 8760 hours.   ECG REPORT Not obtained  [facility] Weights   05/23/16 1901  Weight: 81.647 kg (180 lb)     Cultures:    Component Value Date/Time   SDES URINE, CLEAN CATCH 05/07/2016 0051   SPECREQUEST NONE 05/07/2016 0051   CULT * 05/07/2016 0051    <10,000  COLONIES/mL INSIGNIFICANT GROWTH Performed at [facility]    REPTSTATUS 05/08/2016 FINAL 05/07/2016 0051     Radiological Exams on Admission: Ct Outside Films Body  05/23/2016  CLINICAL DATA:  This exam is stored here for comparison purposes only and was performed at an outside facility.   Please contact the originating institution for any associated interpretation or report.    Chart has been reviewed    Assessment/Plan   30 y.o. male with medical history significant of recently diagnosed diabetes found to have evidence of pyelonephritis on CT scan with associated micro-perinephric abscess and splenomegaly  Present on Admission:  . DM type 2 causing complication (HCC) - will order sliding scale hold metformin  . Pyelonephritis - treat with Rocephin etiology unclear urine culture so far has been negative  . Perinephric abscess -minimal will not require an dementia radiology draining. If patient does worse medically may require reimaging  . Splenomegaly -etiology unclear Will test for viral etiology. Check HIV status. Told patient about hyper splenic precautions. He may need to have follow-up with hematology oncology for is no resolution  . Anemia - obtain anemia panel Hemoccult stool continue to monitor  Other plan as per orders.  DVT prophylaxis:     Lovenox     Code Status:  FULL CODE  as per patient    Family Communication:   Family not at  Bedside    Disposition Plan:     To home once workup is complete and patient is stable   Consults called: none    Admission status:    inpatient      Level of care     medical floor     Alexie Samson 05/23/2016, 9:55 PM    [facility]  Pager [phone]   after 2 AM please page floor coverage PA If 7AM-7PM, please contact the day team taking care of the patient  [email or web address]  Password [email or web address]

## 2016-05-23 NOTE — ED Provider Notes (Signed)
CSN: [id number]     Arrival date & time 05/23/16  1355 History   First MD Initiated Contact with Patient 05/23/16 1759     Chief Complaint  Patient presents with  . Pyelonephritis     (Consider location/radiation/quality/duration/timing/severity/associated sxs/prior Treatment) HPI Comments: Mike Moore is a 30 y.o. male with a PMHx of ?HTN, hearing impairment, and DM2, who presents to the ED sent in by his regular doctor with abnormal CT results. He is a very strange convoluted story that began approximately 1 month ago where he states he had fevers and thought he had a virus, recovered from this, then 2 weeks ago he came into the ED for fevers and URI symptoms again, was diagnosed with diabetes and started on metformin, and states that this illness also resolved, but he began having mild abdominal pain after that, which worsened approximately 1 week ago. On Friday and Saturday 5 days ago he developed fevers again with Tmax 101.6 but they've resolved since then and have not returned. Due to the ongoing abdominal pain he went to see his primary care doctor Manon Hilding PA-C at [facility]) yesterday who sent him for a CT scan today that he had done at [facility] on [facility], and was called and told that it showed that he had "a kidney infection, kidney abscess, and enlarged spleen". He brought the CD with him of the images from the CT scan, but left it in his car. He is not sure if he has the CT reading as well. He states he had a rectal exam done yesterday at the PCP's office and was told that he did not have any blood in his stool.  He states that his abdominal pain was originally 7/10 on Friday, but has since resolved and is currently 0/10, describing it as intermittent cramping in the suprapubic and right lower quadrant region, nonradiating, worse with palpation, and with no treatments tried for his pain. He denies any ongoing fevers, chills, chest pain, shortness of breath, cough, nausea,  vomiting, diarrhea, constipation, obstipation, melena, hematochezia, rectal pain, dysuria, hematuria, malodorous urine, increased urine frequency, testicular pain or swelling, penile discharge, flank pain, numbness, tingling, or focal weakness.  He denies any recent travel, sick contacts, suspicious food intake, alcohol use, or chronic NSAID use.  Patient is a 30 y.o. male presenting with abdominal pain. The history is provided by the patient and medical records. No language interpreter was used.  Abdominal Pain Pain location:  Suprapubic and RLQ Pain quality: cramping   Pain radiates to:  Does not radiate Pain severity:  Moderate Onset quality:  Gradual Duration:  1 week Timing:  Intermittent Progression:  Unchanged Chronicity:  New Context: recent illness   Context: not recent travel, not sick contacts and not suspicious food intake   Relieved by:  None tried Worsened by:  Palpation Ineffective treatments:  None tried Associated symptoms: fever (last week Fri/Sat with TMax 101.6 but then resolved since then)   Associated symptoms: no chest pain, no chills, no constipation, no cough, no diarrhea, no dysuria, no flatus, no hematemesis, no hematochezia, no hematuria, no melena, no nausea, no shortness of breath and no vomiting   Risk factors: no alcohol abuse and no NSAID use     Past Medical History  Diagnosis Date  . Diabetes mellitus without complication (HCC)    History reviewed. No pertinent past surgical history. History reviewed. No pertinent family history. Social History  Substance Use Topics  . Smoking status: Never Smoker   .  Smokeless tobacco: None  . Alcohol Use: No    Review of Systems  Constitutional: Positive for fever (last week Fri/Sat with TMax 101.6 but then resolved since then). Negative for chills.  Respiratory: Negative for cough and shortness of breath.   Cardiovascular: Negative for chest pain.  Gastrointestinal: Positive for abdominal pain  (RLQ/suprapubic). Negative for nausea, vomiting, diarrhea, constipation, blood in stool, melena, hematochezia, anal bleeding, rectal pain, flatus and hematemesis.  Genitourinary: Negative for dysuria, frequency, hematuria, flank pain, discharge, penile swelling, scrotal swelling, penile pain and testicular pain.  Musculoskeletal: Negative for myalgias and arthralgias.  Skin: Negative for color change.  Allergic/Immunologic: Positive for immunocompromised state (diabetic).  Neurological: Negative for weakness and numbness.  Psychiatric/Behavioral: Negative for confusion.   10 Systems reviewed and are negative for acute change except as noted in the HPI.    Allergies  Review of patient's allergies indicates no known allergies.  Home Medications   Prior to Admission medications   Medication Sig Start Date End Date Taking? Authorizing Provider  acetaminophen (TYLENOL) 500 MG tablet Take 1,000 mg by mouth every 6 (six) hours as needed for mild pain.    Historical Provider, MD  ibuprofen (ADVIL,MOTRIN) 200 MG tablet Take 200 mg by mouth every 6 (six) hours as needed for fever.    Historical Provider, MD  metFORMIN (GLUCOPHAGE) 500 MG tablet Take 1 tablet (500 mg total) by mouth 2 (two) times daily with a meal. 05/07/16   April Palumbo, MD   BP 115/80 mmHg  Pulse 92  Temp(Src) 98.3 F (36.8 C) (Oral)  Resp 16  SpO2 100% Physical Exam  Constitutional: He is oriented to person, place, and time. Vital signs are normal. He appears well-developed and well-nourished.  Non-toxic appearance. No distress.  Afebrile, nontoxic, NAD  HENT:  Head: Normocephalic and atraumatic.  Mouth/Throat: Oropharynx is clear and moist and mucous membranes are normal.  Eyes: Conjunctivae and EOM are normal. Right eye exhibits no discharge. Left eye exhibits no discharge.  Neck: Normal range of motion. Neck supple.  Cardiovascular: Normal rate, regular rhythm, normal heart sounds and intact distal pulses.  Exam  reveals no gallop and no friction rub.   No murmur heard. Pulmonary/Chest: Effort normal and breath sounds normal. No respiratory distress. He has no decreased breath sounds. He has no wheezes. He has no rhonchi. He has no rales.  Abdominal: Soft. Normal appearance and bowel sounds are normal. He exhibits no distension. There is no tenderness. There is no rigidity, no rebound, no guarding, no CVA tenderness, no tenderness at McBurney's point and negative Murphy's sign.  Soft, NTND, +BS throughout, no r/g/r, neg murphy's, neg mcburney's, no CVA TTP   Musculoskeletal: Normal range of motion.  Neurological: He is alert and oriented to person, place, and time. He has normal strength. No sensory deficit.  Skin: Skin is warm, dry and intact. No rash noted.  Psychiatric: He has a normal mood and affect.  Nursing note and vitals reviewed.   ED Course  Procedures (including critical care time) Labs Review Labs Reviewed  COMPREHENSIVE METABOLIC PANEL - Abnormal; Notable for the following:    Sodium 133 (*)    Chloride 99 (*)    Glucose, Bld 187 (*)    Calcium 8.3 (*)    ALT 16 (*)    All other components within normal limits  CBC - Abnormal; Notable for the following:    RBC 3.68 (*)    Hemoglobin 10.1 (*)    HCT 29.1 (*)  All other components within normal limits  URINALYSIS, ROUTINE W REFLEX MICROSCOPIC (NOT AT [facility]) - Abnormal; Notable for the following:    Specific Gravity, Urine >1.046 (*)    Hgb urine dipstick MODERATE (*)    All other components within normal limits  URINE CULTURE  LIPASE, BLOOD  MONONUCLEOSIS SCREEN  URINE MICROSCOPIC-ADD ON  DIFFERENTIAL    Imaging Review Ct Outside Films Body  05/23/2016  CLINICAL DATA:  This exam is stored here for comparison purposes only and was performed at an outside facility.   Please contact the originating institution for any associated interpretation or report.   I have personally reviewed and evaluated these images and lab  results as part of my medical decision-making.   EKG Interpretation None      MDM   Final diagnoses:  RLQ abdominal pain  Pyelonephritis  Gallstones  Splenomegaly  Anemia, unspecified anemia type  Type 2 diabetes mellitus with hyperglycemia, without long-term current use of insulin (HCC)  Hematuria    30 y.o. male here with a very strange and convoluted story which begins 1 month ago when he had fevers and had a virus, recovered, then 2 weeks ago had fevers return, and was told that he was diabetic and was started on metformin, that episode also resolved, but then approximately 1 week ago he developed right lower quadrant abdominal pain and fevers again. His abd pain persisted but the fevers resolved. He finally went to his regular doctor yesterday who obtained a CT scan today and it showed "kidney infection and abscess, and an enlarged spleen" per his report. He brought the CD with him of the CT images, but [facility] so there is no record of these results anywhere. There's also no notes from the [facility] physicians visit on care everywhere. Strangely, his exam is completely benign, no abdominal tenderness, no flank tenderness. He has no urinary complaints either which makes pyelonephritis even stranger of a diagnosis. His labs here show: lipase WNL, CBC with mildly low H/H 10.1/29.1 but no leukocytosis. (Of note, his FOBT was done yesterday and was neg; states the rectal exam was not painful, so prostatitis less likely as well). CMP with gluc 187 without anion gap, otherwise unremarkable. U/A not yet given. Will get monoscreen and UCx, and review the images from the CT scan, then proceed with further work up/management based on these images. Denies wanting anything for pain at this time since his symptoms are resolved. Discussed case with my attending Dr. Fayrene FearingJames who agrees with plan. Will reassess shortly  7:25 PM He brought the CT images in from his car, reviewed them with Dr. Fayrene FearingJames  and we can see where he has splenomegaly as well as R perinephric stranding but the kidneys themselves BOTH have what appear to be cysts: 2 on the R kidney and 1 on the L kidney; I suppose these could be abscesses and not cysts, but both Dr. Fayrene FearingJames and I couldn't exactly define it. I called radiologist who stated that he couldn't read imaging from [facility] unless we loaded the images into the system and then they could read it; will have CT tech load images and then call back to get a stat read. Of note, U/A with moderate hgb, 6-30 RBCs, no squamous, no bacteria, and 0-5 WBCs. Nitrite and leuk neg. Again, this is odd given then "pyelonephritis" diagnosis. Will continue to monitor and reassess after imaging is read.   8:00 PM CT tech returning call, images are loaded.  Will call radiology to make sure it's read STAT. Of note, mono screen neg. Will reassess shortly.   8:35 PM Dr. Gwenyth Benderadparvar (radiologist) returning page, states that he reviewed the images and agrees with all of the findings that were stated, which includes: R perinephric stranding mostly localized to the lower pole, with a very small perinephric abscess (stated it was so small it wouldn't be able to be drained), no intrarenal abscesses, ?b/l renal cysts, splenomegaly, and gallstones without cholecystitis findings. He recommended f/up U/S of kidneys after his course of abx is completed to ensure resolution. Unfortunately the reading cannot be dictated into the system, but this was the verbal reading that he gave me-- and he made an annotation on the images in our system. Will proceed with IV abx, rocephin started, and proceed with admission.   9:07 PM Dr. Adela Glimpseoutova returning page, will admit. I appreciate her help immensely with this very intriguing patient. She asked that I add differential on to his CBC but otherwise doesn't want holding orders in yet. See her notes for further documentation of care/dispo. Pt stable at this time.  BP 142/90 mmHg   Pulse 85  Temp(Src) 97.9 F (36.6 C) (Oral)  Resp 15  Ht 5\' 8"  (1.727 m)  Wt 81.647 kg  BMI 27.38 kg/m2  SpO2 100%  Meds ordered this encounter  Medications  . sodium chloride 0.9 % bolus 1,000 mL    Sig:   . cefTRIAXone (ROCEPHIN) 1 g in dextrose 5 % 50 mL IVPB    Sig:      Raelin Pixler Camprubi-Soms, PA-C 05/23/16 2127  Rolland PorterMark James, MD 06/06/16 [phone]

## 2016-05-24 LAB — CBC
HEMATOCRIT: 27.6 % — AB (ref 39.0–52.0)
HEMOGLOBIN: 9.3 g/dL — AB (ref 13.0–17.0)
MCH: 27.6 pg (ref 26.0–34.0)
MCHC: 33.7 g/dL (ref 30.0–36.0)
MCV: 81.9 fL (ref 78.0–100.0)
Platelets: 282 10*3/uL (ref 150–400)
RBC: 3.37 MIL/uL — AB (ref 4.22–5.81)
RDW: 12.7 % (ref 11.5–15.5)
WBC: 2.7 10*3/uL — AB (ref 4.0–10.5)

## 2016-05-24 LAB — RESPIRATORY PANEL BY PCR
ADENOVIRUS-RVPPCR: NOT DETECTED
Bordetella pertussis: NOT DETECTED
CHLAMYDOPHILA PNEUMONIAE-RVPPCR: NOT DETECTED
CORONAVIRUS 229E-RVPPCR: NOT DETECTED
CORONAVIRUS HKU1-RVPPCR: NOT DETECTED
CORONAVIRUS NL63-RVPPCR: NOT DETECTED
CORONAVIRUS OC43-RVPPCR: NOT DETECTED
INFLUENZA A H1 2009-RVPPR: NOT DETECTED
INFLUENZA A H3-RVPPCR: NOT DETECTED
INFLUENZA B-RVPPCR: NOT DETECTED
Influenza A H1: NOT DETECTED
Influenza A: NOT DETECTED
MYCOPLASMA PNEUMONIAE-RVPPCR: NOT DETECTED
Metapneumovirus: NOT DETECTED
PARAINFLUENZA VIRUS 1-RVPPCR: NOT DETECTED
Parainfluenza Virus 2: NOT DETECTED
Parainfluenza Virus 3: NOT DETECTED
Parainfluenza Virus 4: NOT DETECTED
Respiratory Syncytial Virus: NOT DETECTED
Rhinovirus / Enterovirus: NOT DETECTED

## 2016-05-24 LAB — PHOSPHORUS: PHOSPHORUS: 4 mg/dL (ref 2.5–4.6)

## 2016-05-24 LAB — RETICULOCYTES
RBC.: 3.37 MIL/uL — ABNORMAL LOW (ref 4.22–5.81)
Retic Count, Absolute: 50.6 10*3/uL (ref 19.0–186.0)
Retic Ct Pct: 1.5 % (ref 0.4–3.1)

## 2016-05-24 LAB — COMPREHENSIVE METABOLIC PANEL
ALBUMIN: 3.3 g/dL — AB (ref 3.5–5.0)
ALT: 14 U/L — ABNORMAL LOW (ref 17–63)
ANION GAP: 7 (ref 5–15)
AST: 12 U/L — ABNORMAL LOW (ref 15–41)
Alkaline Phosphatase: 49 U/L (ref 38–126)
BILIRUBIN TOTAL: 0.3 mg/dL (ref 0.3–1.2)
BUN: 8 mg/dL (ref 6–20)
CO2: 26 mmol/L (ref 22–32)
Calcium: 8.4 mg/dL — ABNORMAL LOW (ref 8.9–10.3)
Chloride: 106 mmol/L (ref 101–111)
Creatinine, Ser: 0.65 mg/dL (ref 0.61–1.24)
GFR calc non Af Amer: >60 mL/min (ref >60)
GLUCOSE: 130 mg/dL — AB (ref 65–99)
POTASSIUM: 3.9 mmol/L (ref 3.5–5.1)
Sodium: 139 mmol/L (ref 135–145)
TOTAL PROTEIN: 7.4 g/dL (ref 6.5–8.1)

## 2016-05-24 LAB — MAGNESIUM: MAGNESIUM: 2.3 mg/dL (ref 1.7–2.4)

## 2016-05-24 LAB — IRON AND TIBC
IRON: 70 ug/dL (ref 45–182)
Saturation Ratios: 26 % (ref 17.9–39.5)
TIBC: 266 ug/dL (ref 250–450)
UIBC: 196 ug/dL

## 2016-05-24 LAB — GLUCOSE, CAPILLARY
GLUCOSE-CAPILLARY: 111 mg/dL — AB (ref 65–99)
GLUCOSE-CAPILLARY: 171 mg/dL — AB (ref 65–99)
GLUCOSE-CAPILLARY: 156 mg/dL — AB (ref 65–99)
GLUCOSE-CAPILLARY: 149 mg/dL — AB (ref 65–99)

## 2016-05-24 LAB — FOLATE: FOLATE: 14.1 ng/mL (ref >5.9)

## 2016-05-24 LAB — VITAMIN B12: Vitamin B-12: 705 pg/mL (ref 180–914)

## 2016-05-24 LAB — FERRITIN: FERRITIN: 283 ng/mL (ref 24–336)

## 2016-05-24 LAB — TSH: TSH: 8.566 u[IU]/mL — ABNORMAL HIGH (ref 0.350–4.500)

## 2016-05-24 MED ORDER — LIVING WELL WITH DIABETES BOOK
Freq: Once | Status: DC
  Filled 2016-05-24: qty 1

## 2016-05-24 NOTE — Plan of Care (Signed)
Problem: Food- and Nutrition-Related Knowledge Deficit (NB-1.1) Goal: Nutrition education Formal process to instruct or train a patient/client in a skill or to impart knowledge to help patients/clients voluntarily manage or modify food choices and eating behavior to maintain or improve health. Outcome: Completed/Met Date Met:  05/24/16  RD consulted for nutrition education regarding diabetes.   No results found for: HGBA1C  RD provided "Carbohydrate Counting for People with Diabetes" handout from the Academy of Nutrition and Dietetics. Discussed different food groups and their effects on blood sugar, emphasizing carbohydrate-containing foods. Provided list of carbohydrates and recommended serving sizes of common foods.  Discussed importance of controlled and consistent carbohydrate intake throughout the day. Provided examples of ways to balance meals/snacks and encouraged intake of high-fiber, whole grain complex carbohydrates. Teach back method used.  Expect fair compliance.  Body mass index is 27.82 kg/(m^2). Pt meets criteria for overweight based on current BMI.  Current diet order is carb mod, patient is consuming approximately 100% of meals at this time. Labs and medications reviewed. No further nutrition interventions warranted at this time. RD contact information provided. If additional nutrition issues arise, please re-consult RD.  Satira Anis. Ahava Kissoon, MS, RD LDN Inpatient Clinical Dietitian Pager [phone]

## 2016-05-24 NOTE — Progress Notes (Signed)
PROGRESS NOTE    Ursula BeathRobert Birkeland  ZOX:[id number] DOB: 08/28/1986 DOA: 05/23/2016 PCP: No PCP Per Patient  Outpatient Specialists:   Brief Narrative: As per Dr. Adela Glimpseoutova - 30 y.o. male with medical history significant for recently diagnosed diabetes. Presented with about 1 month of intermittent fevers on and off he was treated for presumed bronchitis with a Z-Pak his fever would go away but then would return. He was seen in the emergency department and diagnosed with viral illness on 13 of June at that time his HbA1c was 12.6. His urine and blood cultures at that time did not grow anything. About 2 weeks ago he developed right flank Lower quadrant pain, also he continued to have recurrent fevers and presented to [facility]. Given right lower quadrant pain they will concern for appendicitis and obtained CT of the abdomen and pelvis which was read today showing perinephric stranding with microabscess and splenomegaly he was sent to emergency department. His fever has currently resolved. No nausea, vomiting or diarrhea. Hemoglobin A1c is 9.1 LFTs are normal  Assessment & Plan:   Active Problems:   DM type 2 causing complication (HCC)   Pyelonephritis   Perinephric abscess   Splenomegaly   Anemia  30 y.o. male with medical history significant of recently diagnosed diabetes found to have evidence of pyelonephritis on CT scan with associated micro-perinephric abscess and splenomegaly  Present on Admission:  . DM type 2 causing complication (HCC) - will order sliding scale hold metformin  . Pyelonephritis - treat with Rocephin etiology unclear urine culture so far has been negative  . Perinephric abscess -minimal will not require an dementia radiology draining. If patient does worse medically may require reimaging  . Splenomegaly -etiology unclear Will test for viral etiology. Check HIV status. Told patient about hyper splenic precautions. He may need to have follow-up with hematology oncology for  is no resolution  . Anemia - obtain anemia panel Hemoccult stool continue to monitor  Other plan as per orders.  DVT prophylaxis: Lovenox   Code Status: FULL CODE as per patient   Family Communication: Family not at Bedside   Disposition Plan: To home once workup is complete and patient is stable   Consults called: none   Admission status: inpatient   Subjective: No complaints. No fever or chills.  Objective: Filed Vitals:   05/23/16 1901 05/23/16 2226 05/23/16 2228 05/24/16 0612  BP:  122/91  135/89  Pulse:  82  67  Temp:  98.8 F (37.1 C)  97.7 F (36.5 C)  TempSrc:  Oral  Oral  Resp:  20  20  Height: 5\' 8"  (1.727 m)  5\' 8"  (1.727 m)   Weight: 81.647 kg (180 lb)  82.963 kg (182 lb 14.4 oz)   SpO2:  100%  100%   No intake or output data in the 24 hours ending 05/24/16 0949 Filed Weights   05/23/16 1901 05/23/16 2228  Weight: 81.647 kg (180 lb) 82.963 kg (182 lb 14.4 oz)    Examination:  General exam: Appears calm and comfortable  Respiratory system: Clear to auscultation. Respiratory effort normal. Cardiovascular system: S1 & S2 heard, RRR.   Gastrointestinal system: Abdomen is nondistended, soft and nontender.   Central nervous system: Alert and oriented. Moves all limbs. Extremities: No edema.   Data Reviewed: I have personally reviewed following labs and imaging studies  CBC:  Recent Labs Lab 05/23/16 1526 05/24/16 0538  WBC 4.1 2.7*  NEUTROABS 2.9  --   HGB 10.1* 9.3*  HCT 29.1* 27.6*  MCV 79.1 81.9  PLT 314 282   Basic Metabolic Panel:  Recent Labs Lab 05/23/16 1526 05/24/16 0538  NA 133* 139  K 3.8 3.9  CL 99* 106  CO2 23 26  GLUCOSE 187* 130*  BUN 9 8  CREATININE 0.76 0.65  CALCIUM 8.3* 8.4*  MG  --  2.3  PHOS  --  4.0   GFR: Estimated Creatinine Clearance: 140.4 mL/min (by C-G formula based on Cr of 0.65). Liver Function Tests:  Recent Labs Lab 05/23/16 1526 05/24/16 0538  AST 16 12*  ALT 16*  14*  ALKPHOS 51 49  BILITOT 0.8 0.3  PROT 7.9 7.4  ALBUMIN 3.8 3.3*    Recent Labs Lab 05/23/16 1526  LIPASE 37   No results for input(s): AMMONIA in the last 168 hours. Coagulation Profile: No results for input(s): INR, PROTIME in the last 168 hours. Cardiac Enzymes: No results for input(s): CKTOTAL, CKMB, CKMBINDEX, TROPONINI in the last 168 hours. BNP (last 3 results) No results for input(s): PROBNP in the last 8760 hours. HbA1C: No results for input(s): HGBA1C in the last 72 hours. CBG:  Recent Labs Lab 05/23/16 2321 05/24/16 0739  GLUCAP 122* 111*   Lipid Profile: No results for input(s): CHOL, HDL, LDLCALC, TRIG, CHOLHDL, LDLDIRECT in the last 72 hours. Thyroid Function Tests:  Recent Labs  05/24/16 0538  TSH 8.566*   Anemia Panel:  Recent Labs  05/24/16 0538  RETICCTPCT 1.5   Urine analysis:    Component Value Date/Time   COLORURINE YELLOW 05/23/2016 1836   APPEARANCEUR CLEAR 05/23/2016 1836   LABSPEC >1.046* 05/23/2016 1836   PHURINE 6.0 05/23/2016 1836   GLUCOSEU NEGATIVE 05/23/2016 1836   HGBUR MODERATE* 05/23/2016 1836   BILIRUBINUR NEGATIVE 05/23/2016 1836   KETONESUR NEGATIVE 05/23/2016 1836   PROTEINUR NEGATIVE 05/23/2016 1836   NITRITE NEGATIVE 05/23/2016 1836   LEUKOCYTESUR NEGATIVE 05/23/2016 1836   Sepsis Labs: @LABRCNTIP (procalcitonin:4,lacticidven:4)  )No results found for this or any previous visit (from the past 240 hour(s)).       Radiology Studies: Ct Outside Films Body  05/23/2016  CLINICAL DATA:  This exam is stored here for comparison purposes only and was performed at an outside facility.   Please contact the originating institution for any associated interpretation or report.        Scheduled Meds: . cefTRIAXone (ROCEPHIN)  IV  1 g Intravenous QHS  . enoxaparin (LOVENOX) injection  40 mg Subcutaneous QHS  . insulin aspart  0-15 Units Subcutaneous TID WC  . insulin aspart  0-5 Units Subcutaneous QHS  .  living well with diabetes book   Does not apply Once  . pneumococcal 23 valent vaccine  0.5 mL Intramuscular Tomorrow-1000   Continuous Infusions:    LOS: 1 day    Time spent: 25 Minutes.    Berton MountSylvester Gwynneth Fabio, MD  [facility] Pager #: [phone] 7PM-7AM contact night coverage as above

## 2016-05-25 DIAGNOSIS — E118 Type 2 diabetes mellitus with unspecified complications: Secondary | ICD-10-CM

## 2016-05-25 DIAGNOSIS — D649 Anemia, unspecified: Secondary | ICD-10-CM

## 2016-05-25 DIAGNOSIS — N12 Tubulo-interstitial nephritis, not specified as acute or chronic: Principal | ICD-10-CM

## 2016-05-25 DIAGNOSIS — R161 Splenomegaly, not elsewhere classified: Secondary | ICD-10-CM

## 2016-05-25 LAB — URINE CULTURE: CULTURE: NO GROWTH

## 2016-05-25 LAB — GLUCOSE, CAPILLARY
GLUCOSE-CAPILLARY: 136 mg/dL — AB (ref 65–99)
GLUCOSE-CAPILLARY: 170 mg/dL — AB (ref 65–99)
Glucose-Capillary: 132 mg/dL — ABNORMAL HIGH (ref 65–99)

## 2016-05-25 LAB — T4, FREE: Free T4: 1.03 ng/dL (ref 0.61–1.12)

## 2016-05-25 LAB — HIV ANTIBODY (ROUTINE TESTING W REFLEX): HIV Screen 4th Generation wRfx: NONREACTIVE

## 2016-05-25 MED ORDER — CIPROFLOXACIN HCL 500 MG PO TABS: 500.0000 mg | ORAL_TABLET | Freq: Two times a day (BID) | ORAL | Status: AC

## 2016-05-25 MED ORDER — CIPROFLOXACIN HCL 500 MG PO TABS
500.0000 mg | ORAL_TABLET | Freq: Two times a day (BID) | ORAL | Status: DC
  Administered 2016-05-25: 500 mg via ORAL
  Filled 2016-05-25: qty 1

## 2016-05-25 NOTE — Progress Notes (Signed)
Inpatient Diabetes Program Recommendations  AACE/ADA: New Consensus Statement on Inpatient Glycemic Control (2015)  Target Ranges:  Prepandial:   less than 140 mg/dL      Peak postprandial:   less than 180 mg/dL (1-2 hours)      Critically ill patients:  140 - 180 mg/dL   Lab Results  Component Value Date   GLUCAP 136* 05/25/2016    Review of Glycemic Control  Inpatient Diabetes Program Recommendations:   Late entry: seen 05/24/16 at 4pm Consult received for new-onset dm.  This coordinator met with patient to discuss new diagnosis.  Pt is accepting of diagnosis and is ready to learn.  He has been seeking information on the Internet and is interested in app's to use on his phone for dm management. Resources given. Pt has hearing deficits and wears hearing aids.  Discussed basic diabetes management through lifestyle modification with diet and exercise. Pt states he runs a 5K almost every month.  Discussed the importance of monitoring glucose at home and taking medications as prescribed.  Pt is interested in going to OP education which has been ordered with MD cosign.  Pt will need RX for glucose meter, strips, lancets at discharge.  No questions/concerns at the end of our visit. Expect good compliance.    Thank you  Raoul Pitch MSN, RN,CDE Inpatient Diabetes Coordinator [phone] (team pager)

## 2016-05-25 NOTE — Progress Notes (Signed)
Pt left at this time driving self. Alert, oriented, and without c/o. Discharge instructions given/explained with pt verbalizing understanding. Pt aware to pickup prescription at pharmacy.  Followup appointment noted.

## 2016-05-25 NOTE — Discharge Instructions (Addendum)
Pyelonephritis, Adult °Pyelonephritis is a kidney infection. The kidneys are the organs that filter a person's blood and move waste out of the bloodstream and into the urine. Urine passes from the kidneys, through the ureters, and into the bladder. There are two main types of pyelonephritis: °· Infections that come on quickly without any warning (acute pyelonephritis). °· Infections that last for a long period of time (chronic pyelonephritis). °In most cases, the infection clears up with treatment and does not cause further problems. More severe infections or chronic infections can sometimes spread to the bloodstream or lead to other problems with the kidneys. °CAUSES °This condition is usually caused by: °· Bacteria traveling from the bladder to the kidney through infected urine. The urine in the bladder can become infected with bacteria from: °· Bladder infection (cystitis). °· Inflammation of the prostate gland (prostatitis). °· Sexual intercourse, in females. °· Bacteria traveling from the bloodstream to the kidney. °RISK FACTORS °This condition is more likely to develop in: °· Pregnant women. °· Older people. °· People who have diabetes. °· People who have kidney stones or bladder stones. °· People who have other abnormalities of the kidney or ureter. °· People who have a catheter placed in the bladder. °· People who have cancer. °· People who are sexually active. °· Women who use spermicides. °· People who have had a prior urinary tract infection. °SYMPTOMS °Symptoms of this condition include: °· Frequent urination. °· Strong or persistent urge to urinate. °· Burning or stinging when urinating. °· Abdominal pain. °· Back pain. °· Pain in the side or flank area. °· Fever. °· Chills. °· Blood in the urine, or dark urine. °· Nausea. °· Vomiting. °DIAGNOSIS °This condition may be diagnosed based on: °· Medical history and physical exam. °· Urine tests. °· Blood tests. °You may also have imaging tests of the  kidneys, such as an ultrasound or CT scan. °TREATMENT °Treatment for this condition may depend on the severity of the infection. °· If the infection is mild and is found early, you may be treated with antibiotic medicines taken by mouth. You will need to drink fluids to remain hydrated. °· If the infection is more severe, you may need to stay in the hospital and receive antibiotics given directly into a vein through an IV tube. You may also need to receive fluids through an IV tube if you are not able to remain hydrated. After your hospital stay, you may need to take oral antibiotics for a period of time. °Other treatments may be required, depending on the cause of the infection. °HOME CARE INSTRUCTIONS °Medicines °· Take over-the-counter and prescription medicines only as told by your health care provider. °· If you were prescribed an antibiotic medicine, take it as told by your health care provider. Do not stop taking the antibiotic even if you start to feel better. °General Instructions °· Drink enough fluid to keep your urine clear or pale yellow. °· Avoid caffeine, tea, and carbonated beverages. They tend to irritate the bladder. °· Urinate often. Avoid holding in urine for long periods of time. °· Urinate before and after sex. °· After a bowel movement, women should cleanse from front to back. Use each tissue only once. °· Keep all follow-up visits as told by your health care provider. This is important. °SEEK MEDICAL CARE IF: °· Your symptoms do not get better after 2 days of treatment. °· Your symptoms get worse. °· You have a fever. °SEEK IMMEDIATE MEDICAL CARE IF: °· You   are unable to take your antibiotics or fluids.  You have shaking chills.  You vomit.  You have severe flank or back pain.  You have extreme weakness or fainting.   This information is not intended to replace advice given to you by your health care provider. Make sure you discuss any questions you have with your health care  provider.   Document Released: 11/11/2005 Document Revised: 08/02/2015 Document Reviewed: 03/06/2015 Elsevier Interactive Patient Education 2016 [employer]  Enlarged Spleen The spleen is an organ located in the upper abdomen under your left ribs. It is a spongelike organ, about the size of an orange, which acts as a filter. The spleen is part of the lymph system and filters the blood. It removes old blood cells and abnormal blood cells. It is also part of the immune response and helps fight infections. An enlarged spleen (splenomegaly) is usually noticed when it is almost twice its normal size. CAUSES  There are many possible causes of an enlarged spleen. These causes include:  Infections (viral, bacterial, or parasitic).  Liver cirrhosis and other liver diseases.  Hemolytic anemia (types of anemia that lower your red blood cell count) and other blood diseases.  Hypersplenism (reduction in many types of blood cells by an enlarged spleen).  Blood cancers (leukemia, Hodgkin's disease).  Metabolic disorders (Gaucher's disease, Niemann-Pick disease).  Tumors and cysts.  Pressure or blood clots in the veins of the spleen.  Connective tissue disorders (lupus, rheumatoid arthritis with Felty's syndrome). SYMPTOMS  An enlarged spleen may not always cause symptoms. If symptoms do occur, they may include:  Pain in the upper left abdomen (pain may spread to the left shoulder or get worse when you take a breath).  Feeling full without eating or eating only a small amount.  Feeling tired.  Chronic infections.  Bleeding easily. DIAGNOSIS  Tests may include:  Physical examination of the left upper abdomen.  Blood tests to check red and white blood cells and other proteins and enzymes.  Imaging tests, such as abdominal ultrasonography, computerized X-ray scan (computed tomography, CT), and computerized magnetic scan (magnetic resonance imaging, MRI).  Taking a tissue sample  (biopsy) of the liver to examine it.  Examining a bone marrow biopsy sample. TREATMENT  Treatment varies depending on the cause of the enlarged spleen. Treatment aims to manage the conditions that cause swelling of the spleen and reduce the size of the spleen. Treatment may include:  Medications to eliminate infection or treat disease.  Radiation therapy.  Blood transfusions.  Vaccinations. If these treatments are not successful, or the cause cannot be determined, surgery to remove the spleen (splenectomy) may be recommended. HOME CARE INSTRUCTIONS   Take all medications as directed.  Take all antibiotics, even if you start to feel better. Discuss with your caregiver the use of a probiotic supplement to prevent stomach upset.  To avoid injury or a ruptured spleen:  Limit activities as directed.  Avoid contact sports.  Wear your seat belt in the car.  See your caregiver for vaccinations, follow up examinations and testing as directed.  Follow all of your caregiver's instructions on managing the conditions that cause your enlarged spleen. PREVENTION  It is not always possible to prevent an enlarged spleen. Reduce your chances of developing an enlarged spleen:  Practice good hygiene to prevent infection.  Get recommended vaccines to prevent infection. SEEK MEDICAL CARE IF:   You develop a fever (more than 100.66F [38.1 C]) or other signs of infection (chills,  feeling unwell).  You experience injury or impact to the spleen area.  Your symptoms do not go away as you and your doctor expected.  You experience increased pain when you take in a breath.  Your symptoms worsen, or you develop new symptoms. MAKE SURE YOU:   Understand these instructions.  Will watch your condition.  Will get help right away if you are not doing well or get worse. Follow up with your caregiver to find out the results of your tests. Not all test results may be available during your visit. If  your test results are not back during the visit, make an appointment with your caregiver to find out the results. Do not assume everything is normal if you have not heard from your caregiver or the medical facility. It is important for you to follow up on all of your test results.    This information is not intended to replace advice given to you by your health care provider. Make sure you discuss any questions you have with your health care provider.   Document Released: 05/01/2010 Document Revised: 12/02/2014 Document Reviewed: 05/01/2015 Elsevier Interactive Patient Education [employer].

## 2016-05-25 NOTE — Discharge Summary (Addendum)
Discharge Summary  Mike Moore ONG:[id number] DOB: Aug 13, 1986  PCP: Mike Moore  Admit date: 05/23/2016 Discharge date: 05/25/2016  Time spent: 25 minutes   Recommendations for Outpatient Follow-up:  1. New medication: Cipro 500 mg by mouth twice a day 10 days 2. Patient will follow with his PCP for further workup of his splenomegaly  Discharge Diagnoses:  Active Hospital Problems   Diagnosis Date Noted  . DM type 2 causing complication (HCC) 05/23/2016  . Pyelonephritis 05/23/2016  . Perinephric abscess 05/23/2016  . Splenomegaly 05/23/2016  . Anemia 05/23/2016    Resolved Hospital Problems   Diagnosis Date Noted Date Resolved  No resolved problems to display.    Discharge Condition: Improved, being discharged home  Diet recommendation: Carb modified   Filed Vitals:   05/25/16 0610 05/25/16 1300  BP: 131/87 127/97  Pulse: 59 80  Temp: 98.1 F (36.7 C) 97.9 F (36.6 C)  Resp: 20 20    History of present illness:  30 year old male with past medical history of diabetes mellitus recently diagnosed who had been having intermittent fevers on and off for the past month and seen 2 weeks prior in the emergency room with a presumed bronchitis. Urine and blood cultures at that time were negative. Soon after, patient developed right flank lower quadrant pain and with recurrent fevers came [facility] on 6/30 or CT of the abdomen was read noting perinephric stranding with microabscess and splenomegaly. Patient discharged, but with recurrent fevers then [facility] room for further evaluation.   Hospital Course:  Active Problems:   DM type 2 causing complication (HCC): CBGs stable during this hospitalization. A1c at 9.1 which is actually an improvement from several months ago.  Initially metformin held on admission as per hospital protocol. Given overall improvements, would favor continuing metformin and sliding scale and encouraging weight  loss rather than starting insulin at this time. Patient also referred for outpatient diabetes education.    Pyelonephritis: CT report not on file here, however patient had disc copy with him. Reviewed this with radiologist and confirmed no evidence of abscess only pyelonephritis. He has been on antibiotics recently which may have clouded issue, so treated with IV Rocephin and then by mouth Cipro 10 more days      Splenomegaly also noted to have incidental splenomegaly on CT. HIV checked which was negative. Otherwise stable, so gave patient information and plans for outpatient follow-up   Anemia   Procedures:  None   Consultations:  None   Discharge Exam: BP 127/97 mmHg  Pulse 80  Temp(Src) 97.9 F (36.6 C) (Oral)  Resp 20  Ht 5\' 8"  (1.727 m)  Wt 82.963 kg (182 lb 14.4 oz)  BMI 27.82 kg/m2  SpO2 100%  General: Alert and oriented 3, no acute distress  Cardiovascular: Regular rate and rhythm, S1-S2  Respiratory: Clear to auscultation bilaterally   Discharge Instructions You were cared for by a hospitalist during your hospital stay. If you have any questions about your discharge medications or the care you received while you were in the hospital after you are discharged, you can call the unit and asked to speak with the hospitalist on call if the hospitalist that took care of you is not available. Once you are discharged, your primary care physician will handle any further medical issues. Please note that NO REFILLS for any discharge medications will be authorized once you are discharged, as it is imperative that you return to your primary care  physician (or establish a relationship with a primary care physician if you do not have one) for your aftercare needs so that they can reassess your need for medications and monitor your lab values.  Discharge Instructions    Ambulatory Referral to DSME/T    Complete by:  As directed   Choose type of training services and number of hours  requested:  Initial DSME/T:  10 hours  Check all special needs that apply to patient requiring 1 on 1 DSME/T:  Hearing  DSME/T Content:  Nutritional management     Ambulatory referral to Nutrition and Diabetic Education    Complete by:  As directed   New diagnosis DM     Diet Carb Modified    Complete by:  As directed      Increase activity slowly    Complete by:  As directed             Medication List    TAKE these medications        ciprofloxacin 500 MG tablet  Commonly known as:  CIPRO  Take 1 tablet (500 mg total) by mouth 2 (two) times daily.     ibuprofen 200 MG tablet  Commonly known as:  ADVIL,MOTRIN  Take 400 mg by mouth every 6 (six) hours as needed for fever.     metFORMIN 500 MG tablet  Commonly known as:  GLUCOPHAGE  Take 1 tablet (500 mg total) by mouth 2 (two) times daily with a meal.       No Known Allergies     Follow-up Information    Schedule an appointment as soon as possible for a visit with Primary doctor in 2 weeks.       The results of significant diagnostics from this hospitalization (including imaging, microbiology, ancillary and laboratory) are listed below for reference.    Significant Diagnostic Studies: Dg Chest 2 View  05/07/2016  CLINICAL DATA:  Pt c/o sob, cough, congestion, weakness, and intermittent fever x 2 weeks, pt hearing impaired EXAM: CHEST  2 VIEW COMPARISON:  None. FINDINGS: Shallow inspiration with atelectasis in the lung bases. Normal heart size and pulmonary vascularity. No focal airspace disease or consolidation in the lungs. No blunting of costophrenic angles. No pneumothorax. Mediastinal contours appear intact. Degenerative changes in the spine. IMPRESSION: Shallow inspiration with atelectasis in the lung bases. Electronically Signed   By: Burman NievesWilliam  Stevens M.D.   On: 05/07/2016 00:53   Ct Outside Films Body  05/23/2016  CLINICAL DATA:  This exam is stored here for comparison purposes only and was performed at an  outside facility.   Please contact the originating institution for any associated interpretation or report.    Microbiology: Recent Results (from the past 240 hour(s))  Urine culture     Status: None   Collection Time: 05/23/16  6:37 PM  Result Value Ref Range Status   Specimen Description URINE, CLEAN CATCH  Final   Special Requests NONE  Final   Culture NO GROWTH Performed at [facility]   Final   Report Status 05/25/2016 FINAL  Final  Respiratory Panel by PCR     Status: None   Collection Time: 05/23/16 11:40 PM  Result Value Ref Range Status   Adenovirus NOT DETECTED NOT DETECTED Final   Coronavirus 229E NOT DETECTED NOT DETECTED Final   Coronavirus HKU1 NOT DETECTED NOT DETECTED Final   Coronavirus NL63 NOT DETECTED NOT DETECTED Final   Coronavirus OC43 NOT DETECTED NOT DETECTED Final  Metapneumovirus NOT DETECTED NOT DETECTED Final   Rhinovirus / Enterovirus NOT DETECTED NOT DETECTED Final   Influenza A NOT DETECTED NOT DETECTED Final   Influenza A H1 NOT DETECTED NOT DETECTED Final   Influenza A H1 2009 NOT DETECTED NOT DETECTED Final   Influenza A H3 NOT DETECTED NOT DETECTED Final   Influenza B NOT DETECTED NOT DETECTED Final   Parainfluenza Virus 1 NOT DETECTED NOT DETECTED Final   Parainfluenza Virus 2 NOT DETECTED NOT DETECTED Final   Parainfluenza Virus 3 NOT DETECTED NOT DETECTED Final   Parainfluenza Virus 4 NOT DETECTED NOT DETECTED Final   Respiratory Syncytial Virus NOT DETECTED NOT DETECTED Final   Bordetella pertussis NOT DETECTED NOT DETECTED Final   Chlamydophila pneumoniae NOT DETECTED NOT DETECTED Final   Mycoplasma pneumoniae NOT DETECTED NOT DETECTED Final     Labs: Basic Metabolic Panel:  Recent Labs Lab 05/23/16 1526 05/24/16 0538  NA 133* 139  K 3.8 3.9  CL 99* 106  CO2 23 26  GLUCOSE 187* 130*  BUN 9 8  CREATININE 0.76 0.65  CALCIUM 8.3* 8.4*  MG  --  2.3  PHOS  --  4.0   Liver Function Tests:  Recent Labs Lab  05/23/16 1526 05/24/16 0538  AST 16 12*  ALT 16* 14*  ALKPHOS 51 49  BILITOT 0.8 0.3  PROT 7.9 7.4  ALBUMIN 3.8 3.3*    Recent Labs Lab 05/23/16 1526  LIPASE 37   No results for input(s): AMMONIA in the last 168 hours. CBC:  Recent Labs Lab 05/23/16 1526 05/24/16 0538  WBC 4.1 2.7*  NEUTROABS 2.9  --   HGB 10.1* 9.3*  HCT 29.1* 27.6*  MCV 79.1 81.9  PLT 314 282   Cardiac Enzymes: No results for input(s): CKTOTAL, CKMB, CKMBINDEX, TROPONINI in the last 168 hours. BNP: BNP (last 3 results) No results for input(s): BNP in the last 8760 hours.  ProBNP (last 3 results) No results for input(s): PROBNP in the last 8760 hours.  CBG:  Recent Labs Lab 05/24/16 1720 05/24/16 2210 05/25/16 0727 05/25/16 1132 05/25/16 1615  GLUCAP 156* 149* 136* 170* 132*       Signed:  Hollice Espy, MD [facility] 05/25/2016, 5:53 PM

## 2016-05-26 LAB — T3, FREE: T3, Free: 2.9 pg/mL (ref 2.0–4.4)

## 2016-07-02 ENCOUNTER — Encounter: Payer: Self-pay | Admitting: Skilled Nursing Facility1

## 2016-07-02 ENCOUNTER — Encounter: Payer: BLUE CROSS/BLUE SHIELD | Attending: Physician Assistant | Admitting: Skilled Nursing Facility1

## 2016-07-02 DIAGNOSIS — Z713 Dietary counseling and surveillance: Secondary | ICD-10-CM | POA: Insufficient documentation

## 2016-07-02 DIAGNOSIS — E1165 Type 2 diabetes mellitus with hyperglycemia: Secondary | ICD-10-CM | POA: Insufficient documentation

## 2016-07-02 DIAGNOSIS — E118 Type 2 diabetes mellitus with unspecified complications: Secondary | ICD-10-CM

## 2016-07-02 NOTE — Progress Notes (Signed)
Patient was seen on 07/02/2016 for the first of a series of three diabetes self-management courses at [facility].  Patient Education Plan per assessed needs and concerns is to attend four course education program for Diabetes Self Management Education.  The following learning objectives were met by the patient during this class:  Describe diabetes  State some common risk factors for diabetes  Defines the role of glucose and insulin  Identifies type of diabetes and pathophysiology  Describe the relationship between diabetes and cardiovascular risk  State the members of the Healthcare Team  States the rationale for glucose monitoring  State when to test glucose  State their individual Target Range  State the importance of logging glucose readings  Describe how to interpret glucose readings  Identifies A1C target  Explain the correlation between A1c and eAG values  State symptoms and treatment of high blood glucose  State symptoms and treatment of low blood glucose  Explain proper technique for glucose testing  Identifies proper sharps disposal  Handouts given during class include:  Living Well with Diabetes book  Carb Counting and Meal Planning book  Meal Plan Card  Carbohydrate guide  Meal planning worksheet  Low Sodium Flavoring Tips  The diabetes portion plate  P3S to eAG Conversion Chart  Diabetes Medications  Diabetes Recommended Care Schedule  Support Group  Diabetes Success Plan  Core Class Satisfaction Survey  Follow-Up Plan:  Attend core 2

## 2016-07-09 ENCOUNTER — Encounter: Payer: BLUE CROSS/BLUE SHIELD | Admitting: Dietician

## 2016-07-09 DIAGNOSIS — Z713 Dietary counseling and surveillance: Secondary | ICD-10-CM | POA: Diagnosis not present

## 2016-07-09 DIAGNOSIS — E118 Type 2 diabetes mellitus with unspecified complications: Secondary | ICD-10-CM

## 2016-07-11 NOTE — Progress Notes (Signed)
Patient was seen on 8/15/17for the second of a series of three diabetes self-management courses at the [facility]. The following learning objectives were met by the patient during this class:   Describe the role of different macronutrients on glucose  Explain how carbohydrates affect blood glucose  State what foods contain the most carbohydrates  Demonstrate carbohydrate counting  Demonstrate how to read Nutrition Facts food label  Describe effects of various fats on heart health  Describe the importance of good nutrition for health and healthy eating strategies  Describe techniques for managing your shopping, cooking and meal planning  List strategies to follow meal plan when dining out  Describe the effects of alcohol on glucose and how to use it safely  Goals:  Follow Diabetes Meal Plan as instructed  Eat 3 meals and 2 snacks, every 3-5 hrs  Limit carbohydrate intake to 45 grams carbohydrate/meal Limit carbohydrate intake to 15 grams carbohydrate/snack Add lean protein foods to meals/snacks  Monitor glucose levels as instructed by your doctor   Follow-Up Plan:  Attend Core 3  Work towards following your personal food plan.

## 2016-07-16 ENCOUNTER — Encounter: Payer: BLUE CROSS/BLUE SHIELD | Admitting: Skilled Nursing Facility1

## 2016-07-16 DIAGNOSIS — E118 Type 2 diabetes mellitus with unspecified complications: Secondary | ICD-10-CM

## 2016-07-16 DIAGNOSIS — Z713 Dietary counseling and surveillance: Secondary | ICD-10-CM | POA: Diagnosis not present

## 2016-07-18 ENCOUNTER — Encounter: Payer: Self-pay | Admitting: Skilled Nursing Facility1

## 2016-07-18 NOTE — Progress Notes (Signed)
Patient was seen on 07/16/2016 for the third of a series of three diabetes self-management courses at the [facility]. The following learning objectives were met by the patient during this class:  . State the amount of activity recommended for healthy living . Describe activities suitable for individual needs . Identify ways to regularly incorporate activity into daily life . Identify barriers to activity and ways to over come these barriers  Identify diabetes medications being personally used and their primary action for lowering glucose and possible side effects . Describe role of stress on blood glucose and develop strategies to address psychosocial issues . Identify diabetes complications and ways to prevent them  Explain how to manage diabetes during illness . Evaluate success in meeting personal goal . Establish 2-3 goals that they will plan to diligently work on until they return for the  79-monthfollow-up visit  Goals:   I will count my carb choices at most meals and snacks  I will test my glucose at least 1 times a day, 2 days a week  Your patient has identified these potential barriers to change:  Motivation Finances  Your patient has identified their diabetes self-care support plan as  [facility]:  Attend Monthly Diabetes Support Group as needed or make a future follow up appointment

## 2017-03-17 IMAGING — CR DG CHEST 2V
2 series · 2 of 2 positions shown · non-contrast
Comparison: None.

CLINICAL DATA: Pt c/o sob, cough, congestion, weakness, and
intermittent fever x 2 weeks, pt hearing impaired

EXAM:
CHEST  2 VIEW

[w chest lat]
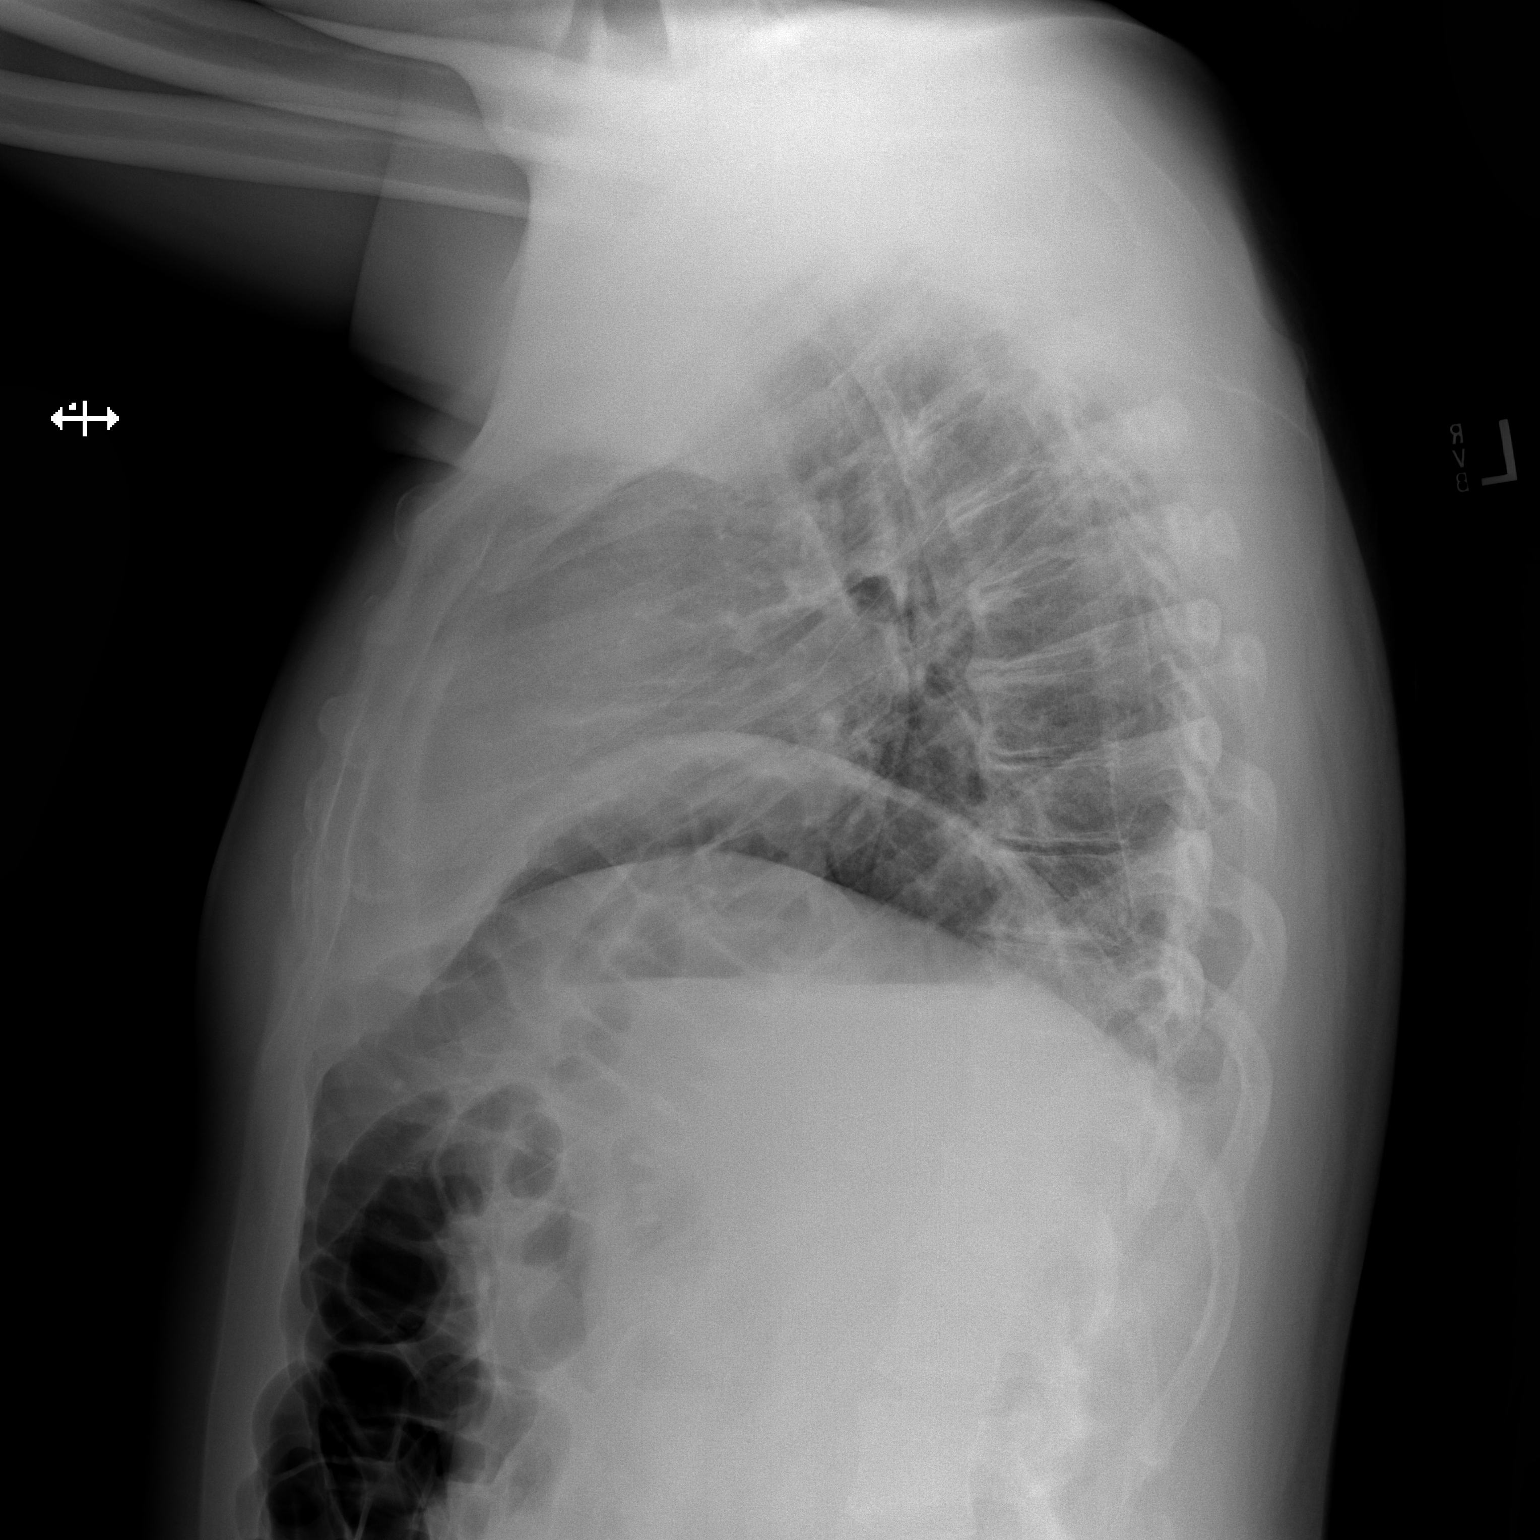

[w chest pa]
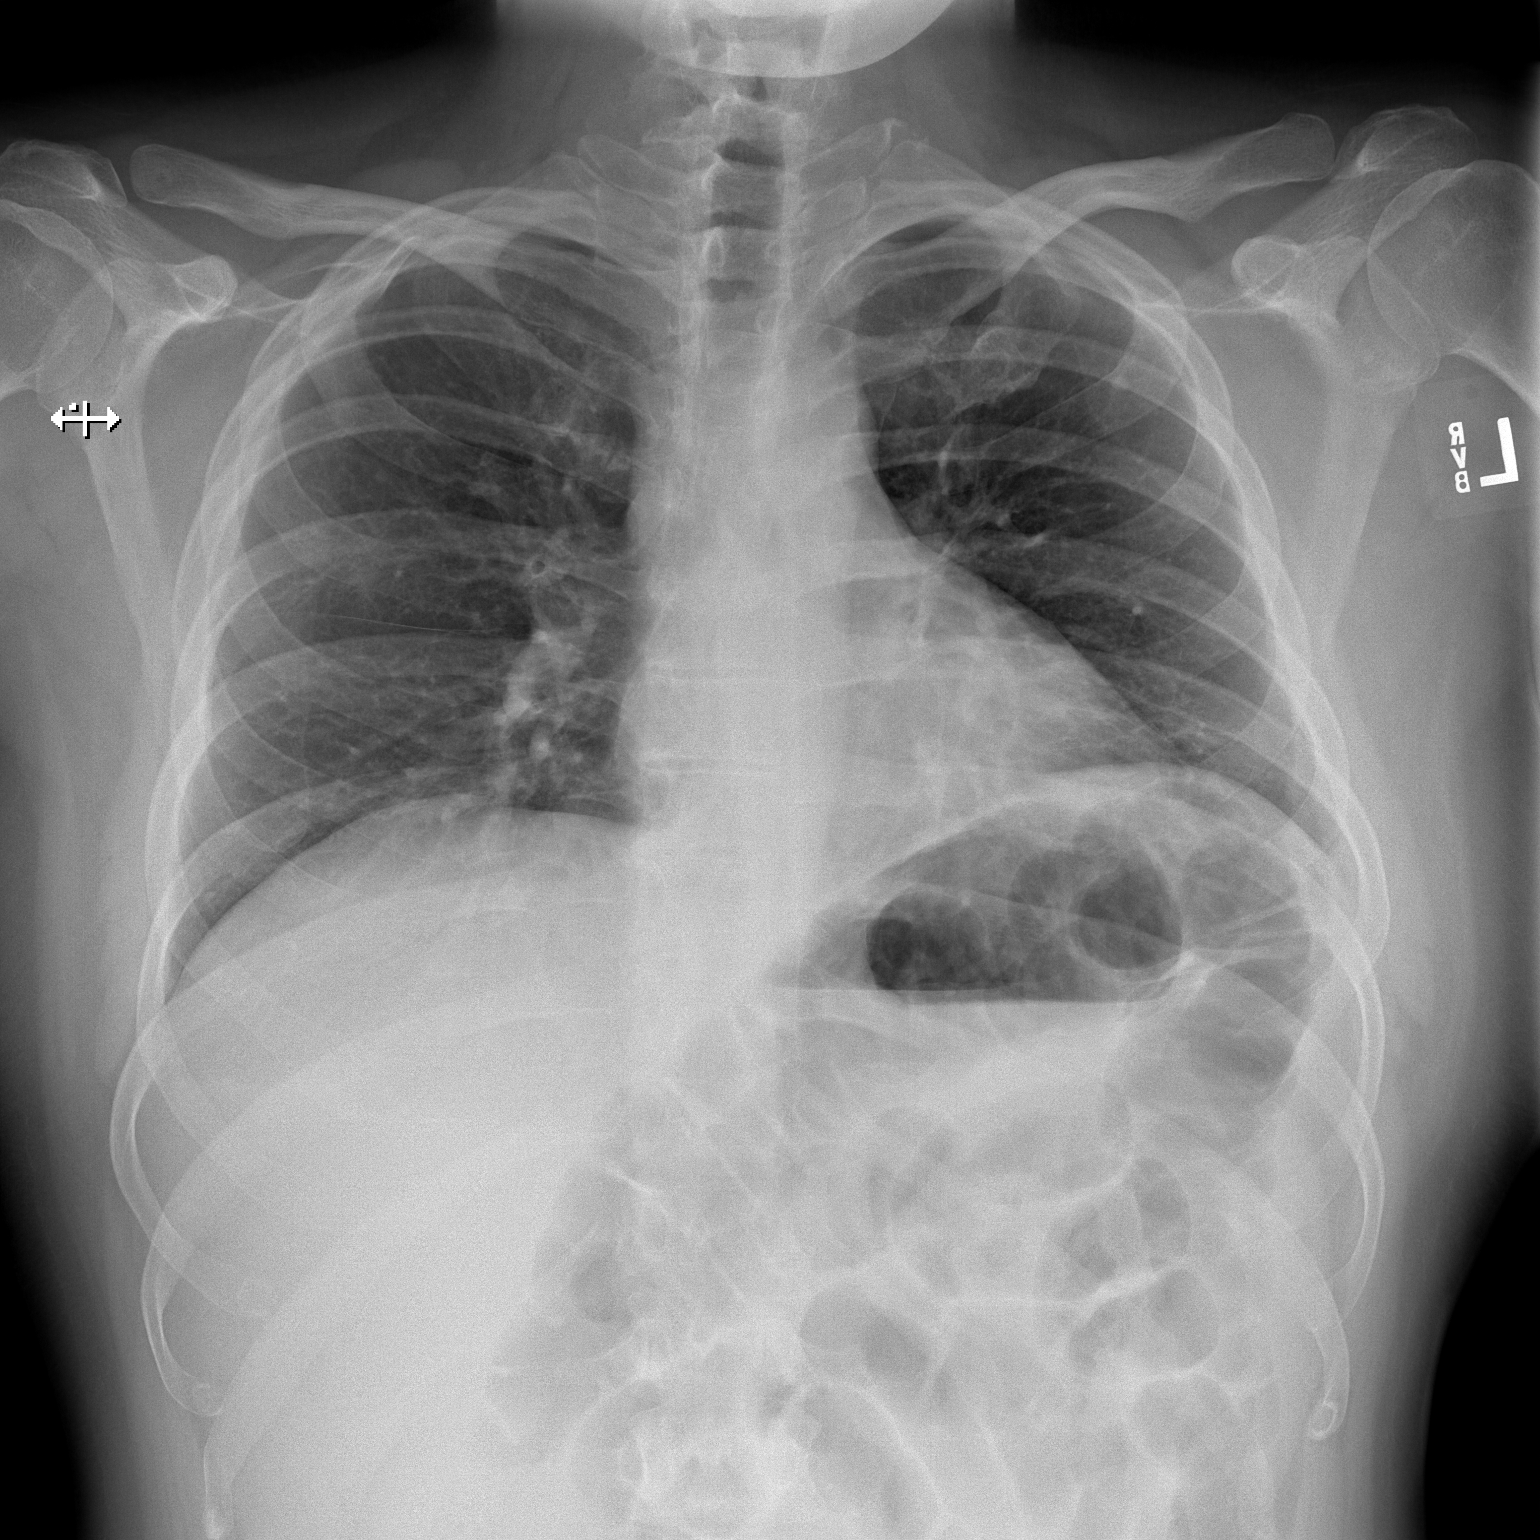

[2 of 2 positions shown; findings below may reference images not displayed]

FINDINGS: Shallow inspiration with atelectasis in the lung bases. Normal heart
size and pulmonary vascularity. No focal airspace disease or
consolidation in the lungs. No blunting of costophrenic angles. No
pneumothorax. Mediastinal contours appear intact. Degenerative
changes in the spine.
IMPRESSION: Shallow inspiration with atelectasis in the lung bases.
